# Patient Record
Sex: Female | Born: 1990 | Hispanic: No | Marital: Married | Smoking: Never smoker
Health system: Southern US, Community
[De-identification: ages and names within clinical notes are randomized; demographics above are authoritative.]

## PROBLEM LIST (undated history)

## (undated) DIAGNOSIS — R7303 Prediabetes: Secondary | ICD-10-CM

## (undated) DIAGNOSIS — F32A Depression, unspecified: Secondary | ICD-10-CM

## (undated) DIAGNOSIS — L309 Dermatitis, unspecified: Secondary | ICD-10-CM

## (undated) DIAGNOSIS — O24419 Gestational diabetes mellitus in pregnancy, unspecified control: Secondary | ICD-10-CM

## (undated) DIAGNOSIS — J302 Other seasonal allergic rhinitis: Secondary | ICD-10-CM

## (undated) HISTORY — DX: Other seasonal allergic rhinitis: J30.2

## (undated) HISTORY — PX: WISDOM TOOTH EXTRACTION: SHX21

## (undated) HISTORY — DX: Dermatitis, unspecified: L30.9

## (undated) HISTORY — PX: DILATION AND CURETTAGE OF UTERUS: SHX78

## (undated) HISTORY — DX: Prediabetes: R73.03

## (undated) HISTORY — PX: LIPOSUCTION: SHX10

---

## 2009-01-08 ENCOUNTER — Emergency Department (HOSPITAL_COMMUNITY): Admission: EM | Admit: 2009-01-08 | Discharge: 2009-01-08 | Payer: Self-pay | Admitting: Emergency Medicine

## 2009-07-23 ENCOUNTER — Inpatient Hospital Stay (HOSPITAL_COMMUNITY): Admission: AD | Admit: 2009-07-23 | Discharge: 2009-07-26 | Payer: Self-pay | Admitting: Obstetrics and Gynecology

## 2010-11-21 LAB — CBC
Hemoglobin: 10.1 g/dL — ABNORMAL LOW (ref 12.0–15.0)
MCV: 86.6 fL (ref 78.0–100.0)
Platelets: 130 10*3/uL — ABNORMAL LOW (ref 150–400)
Platelets: 143 10*3/uL — ABNORMAL LOW (ref 150–400)
RDW: 13.6 % (ref 11.5–15.5)
WBC: 14.4 10*3/uL — ABNORMAL HIGH (ref 4.0–10.5)
WBC: 9.4 10*3/uL (ref 4.0–10.5)

## 2010-11-21 LAB — RPR: RPR Ser Ql: NONREACTIVE

## 2010-11-28 LAB — URINALYSIS, ROUTINE W REFLEX MICROSCOPIC
Glucose, UA: NEGATIVE mg/dL
Protein, ur: NEGATIVE mg/dL
Urobilinogen, UA: 0.2 mg/dL (ref 0.0–1.0)

## 2010-11-28 LAB — WET PREP, GENITAL: Yeast Wet Prep HPF POC: NONE SEEN

## 2010-11-28 LAB — DIFFERENTIAL
Eosinophils Absolute: 0 10*3/uL (ref 0.0–0.7)
Eosinophils Relative: 1 % (ref 0–5)
Lymphs Abs: 1.8 10*3/uL (ref 0.7–4.0)
Monocytes Relative: 8 % (ref 3–12)

## 2010-11-28 LAB — CBC
HCT: 35.3 % — ABNORMAL LOW (ref 36.0–46.0)
MCV: 83.1 fL (ref 78.0–100.0)
RBC: 4.25 MIL/uL (ref 3.87–5.11)
WBC: 7 10*3/uL (ref 4.0–10.5)

## 2012-01-29 ENCOUNTER — Other Ambulatory Visit (HOSPITAL_COMMUNITY): Payer: Self-pay | Admitting: Obstetrics and Gynecology

## 2012-01-29 DIAGNOSIS — Z3682 Encounter for antenatal screening for nuchal translucency: Secondary | ICD-10-CM

## 2012-02-08 ENCOUNTER — Encounter (HOSPITAL_COMMUNITY): Payer: Self-pay

## 2012-02-08 ENCOUNTER — Ambulatory Visit (HOSPITAL_COMMUNITY): Admission: RE | Admit: 2012-02-08 | Payer: Self-pay | Source: Ambulatory Visit

## 2012-02-08 ENCOUNTER — Ambulatory Visit (HOSPITAL_COMMUNITY): Payer: Self-pay | Attending: Obstetrics and Gynecology

## 2012-07-08 ENCOUNTER — Other Ambulatory Visit: Payer: Self-pay | Admitting: Obstetrics and Gynecology

## 2012-08-11 ENCOUNTER — Inpatient Hospital Stay (HOSPITAL_COMMUNITY): Admit: 2012-08-11 | Payer: Self-pay | Admitting: Obstetrics and Gynecology

## 2012-08-11 ENCOUNTER — Encounter (HOSPITAL_COMMUNITY): Payer: Self-pay

## 2012-08-11 SURGERY — Surgical Case
Anesthesia: Choice

## 2014-06-21 ENCOUNTER — Encounter (HOSPITAL_COMMUNITY): Payer: Self-pay

## 2015-09-03 ENCOUNTER — Emergency Department (HOSPITAL_COMMUNITY)
Admission: EM | Admit: 2015-09-03 | Discharge: 2015-09-03 | Disposition: A | Discharge status: Home / Self Care | Payer: Medicaid Other | Attending: Emergency Medicine | Admitting: Emergency Medicine

## 2015-09-03 ENCOUNTER — Emergency Department (HOSPITAL_COMMUNITY): Payer: Medicaid Other

## 2015-09-03 ENCOUNTER — Encounter (HOSPITAL_COMMUNITY): Payer: Self-pay

## 2015-09-03 DIAGNOSIS — S0990XA Unspecified injury of head, initial encounter: Secondary | ICD-10-CM | POA: Diagnosis not present

## 2015-09-03 DIAGNOSIS — S61411A Laceration without foreign body of right hand, initial encounter: Secondary | ICD-10-CM | POA: Insufficient documentation

## 2015-09-03 DIAGNOSIS — R0602 Shortness of breath: Secondary | ICD-10-CM | POA: Diagnosis not present

## 2015-09-03 DIAGNOSIS — Y9289 Other specified places as the place of occurrence of the external cause: Secondary | ICD-10-CM | POA: Diagnosis not present

## 2015-09-03 DIAGNOSIS — S199XXA Unspecified injury of neck, initial encounter: Secondary | ICD-10-CM | POA: Diagnosis not present

## 2015-09-03 DIAGNOSIS — Z23 Encounter for immunization: Secondary | ICD-10-CM | POA: Diagnosis not present

## 2015-09-03 DIAGNOSIS — S0993XA Unspecified injury of face, initial encounter: Secondary | ICD-10-CM

## 2015-09-03 DIAGNOSIS — T1490XA Injury, unspecified, initial encounter: Secondary | ICD-10-CM

## 2015-09-03 DIAGNOSIS — Y9389 Activity, other specified: Secondary | ICD-10-CM | POA: Insufficient documentation

## 2015-09-03 DIAGNOSIS — Y998 Other external cause status: Secondary | ICD-10-CM | POA: Diagnosis not present

## 2015-09-03 DIAGNOSIS — S0083XA Contusion of other part of head, initial encounter: Secondary | ICD-10-CM | POA: Insufficient documentation

## 2015-09-03 MED ORDER — OXYCODONE-ACETAMINOPHEN 5-325 MG PO TABS: 1.0000 | ORAL_TABLET | Freq: Four times a day (QID) | ORAL | Status: DC | PRN

## 2015-09-03 MED ORDER — SODIUM CHLORIDE 0.9 % IV BOLUS (SEPSIS)
1000.0000 mL | Freq: Once | INTRAVENOUS | Status: AC
  Administered 2015-09-03: 1000 mL via INTRAVENOUS

## 2015-09-03 MED ORDER — BACITRACIN ZINC 500 UNIT/GM EX OINT
TOPICAL_OINTMENT | Freq: Two times a day (BID) | CUTANEOUS | Status: DC
  Administered 2015-09-03: 1 via TOPICAL

## 2015-09-03 MED ORDER — OXYCODONE-ACETAMINOPHEN 5-325 MG PO TABS
1.0000 | ORAL_TABLET | Freq: Once | ORAL | Status: AC
  Administered 2015-09-03: 1 via ORAL
  Filled 2015-09-03: qty 1

## 2015-09-03 MED ORDER — TETANUS-DIPHTH-ACELL PERTUSSIS 5-2.5-18.5 LF-MCG/0.5 IM SUSP
0.5000 mL | Freq: Once | INTRAMUSCULAR | Status: AC
  Administered 2015-09-03: 0.5 mL via INTRAMUSCULAR
  Filled 2015-09-03: qty 0.5

## 2015-09-03 MED ORDER — AMOXICILLIN-POT CLAVULANATE 875-125 MG PO TABS: 1.0000 | ORAL_TABLET | Freq: Two times a day (BID) | ORAL | Status: DC

## 2015-09-03 NOTE — Discharge Instructions (Signed)
1. Medications: Percocet for pain AS NEEDED - This can make you very drowsy - please do not drink or drive on this medication, continue usual home medications 2. Treatment: rest, drink plenty of fluids, do not blow nose until seen by ENT physician. 3. Follow Up: Please follow up with the ear, nose, and throat clinic listed in 1-3 days for discussion of your diagnoses and further evaluation after today's visit; Please return to the ER for vomiting, fevers, loss of vision, change in mental status, new or worsening symptoms, any additional concerns.

## 2015-09-03 NOTE — ED Notes (Addendum)
Veronica Bradley [phone] call when ready to be discharged

## 2015-09-03 NOTE — ED Notes (Signed)
Pt transported to CT ?

## 2015-09-03 NOTE — ED Notes (Signed)
Pt verbalized understanding of d/c instructions, prescriptions, and follow-up care. No further questions/concerns, VSS, assisted to lobby in wheelchair.  
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
## 2015-09-03 NOTE — ED Notes (Signed)
PA at bedside.
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
## 2016-03-03 ENCOUNTER — Emergency Department (HOSPITAL_COMMUNITY)
Admission: EM | Admit: 2016-03-03 | Discharge: 2016-03-04 | Disposition: A | Discharge status: Home / Self Care | Payer: Medicaid Other | Attending: Emergency Medicine | Admitting: Emergency Medicine

## 2016-03-03 ENCOUNTER — Encounter (HOSPITAL_COMMUNITY): Payer: Self-pay | Admitting: Emergency Medicine

## 2016-03-03 ENCOUNTER — Emergency Department (HOSPITAL_COMMUNITY): Payer: Medicaid Other

## 2016-03-03 DIAGNOSIS — Y92410 Unspecified street and highway as the place of occurrence of the external cause: Secondary | ICD-10-CM | POA: Insufficient documentation

## 2016-03-03 DIAGNOSIS — M25572 Pain in left ankle and joints of left foot: Secondary | ICD-10-CM | POA: Insufficient documentation

## 2016-03-03 DIAGNOSIS — Y999 Unspecified external cause status: Secondary | ICD-10-CM | POA: Insufficient documentation

## 2016-03-03 DIAGNOSIS — Y939 Activity, unspecified: Secondary | ICD-10-CM | POA: Insufficient documentation

## 2016-03-03 DIAGNOSIS — M542 Cervicalgia: Secondary | ICD-10-CM | POA: Diagnosis not present

## 2016-03-03 MED ORDER — METHOCARBAMOL 500 MG PO TABS: 500.0000 mg | ORAL_TABLET | Freq: Two times a day (BID) | ORAL | Status: DC

## 2016-03-03 MED ORDER — IBUPROFEN 600 MG PO TABS: 600.0000 mg | ORAL_TABLET | Freq: Four times a day (QID) | ORAL | Status: DC | PRN

## 2016-03-03 MED ORDER — HYDROCODONE-ACETAMINOPHEN 5-325 MG PO TABS
1.0000 | ORAL_TABLET | Freq: Once | ORAL | Status: AC
  Administered 2016-03-03: 1 via ORAL
  Filled 2016-03-03: qty 1

## 2016-03-03 MED ORDER — HYDROCODONE-ACETAMINOPHEN 5-325 MG PO TABS: 2.0000 | ORAL_TABLET | ORAL | Status: DC | PRN

## 2016-03-03 MED ORDER — METHOCARBAMOL 500 MG PO TABS
500.0000 mg | ORAL_TABLET | Freq: Once | ORAL | Status: AC
  Administered 2016-03-03: 500 mg via ORAL
  Filled 2016-03-03: qty 1
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
## 2016-03-03 NOTE — ED Notes (Signed)
AP at bedside 

## 2016-03-03 NOTE — Discharge Instructions (Signed)
Take your medications as prescribed as needed for pain relief. I recommend resting and applying ice to affected areas for 15-20 minutes 3-4 times daily as needed for pain relief. Please follow up with a primary care provider from the Resource Guide provided below in one week if your symptoms have not improved. Please return to the Emergency Department if symptoms worsen or new onset of fever, headache, visual changes, lightheadedness, dizziness, neck stiffness, chest pain, shortness of breath, abdominal pain, vomiting, loss of control of bowel or bladder, groin numbness, numbness, tingling, weakness, redness, swelling, warmth.

## 2016-04-10 ENCOUNTER — Encounter (HOSPITAL_COMMUNITY): Payer: Self-pay

## 2016-04-10 DIAGNOSIS — O26899 Other specified pregnancy related conditions, unspecified trimester: Secondary | ICD-10-CM | POA: Diagnosis not present

## 2016-04-10 DIAGNOSIS — Z5321 Procedure and treatment not carried out due to patient leaving prior to being seen by health care provider: Secondary | ICD-10-CM | POA: Diagnosis not present

## 2016-04-10 DIAGNOSIS — R1030 Lower abdominal pain, unspecified: Secondary | ICD-10-CM | POA: Insufficient documentation

## 2016-04-10 LAB — COMPREHENSIVE METABOLIC PANEL
ALBUMIN: 4 g/dL (ref 3.5–5.0)
ALT: 19 U/L (ref 14–54)
ANION GAP: 3 — AB (ref 5–15)
AST: 21 U/L (ref 15–41)
Alkaline Phosphatase: 50 U/L (ref 38–126)
BILIRUBIN TOTAL: 0.6 mg/dL (ref 0.3–1.2)
BUN: 9 mg/dL (ref 6–20)
CHLORIDE: 108 mmol/L (ref 101–111)
CO2: 25 mmol/L (ref 22–32)
Calcium: 9.3 mg/dL (ref 8.9–10.3)
Creatinine, Ser: 0.79 mg/dL (ref 0.44–1.00)
GFR calc Af Amer: >60 mL/min (ref >60)
GLUCOSE: 98 mg/dL (ref 65–99)
POTASSIUM: 3.4 mmol/L — AB (ref 3.5–5.1)
Sodium: 136 mmol/L (ref 135–145)
TOTAL PROTEIN: 6.6 g/dL (ref 6.5–8.1)

## 2016-04-10 LAB — CBC
HEMATOCRIT: 38.7 % (ref 36.0–46.0)
HEMOGLOBIN: 12.7 g/dL (ref 12.0–15.0)
MCH: 27 pg (ref 26.0–34.0)
MCHC: 32.8 g/dL (ref 30.0–36.0)
MCV: 82.2 fL (ref 78.0–100.0)
Platelets: 248 10*3/uL (ref 150–400)
RBC: 4.71 MIL/uL (ref 3.87–5.11)
RDW: 12.8 % (ref 11.5–15.5)
WBC: 6.5 10*3/uL (ref 4.0–10.5)

## 2016-04-10 LAB — I-STAT BETA HCG BLOOD, ED (MC, WL, AP ONLY): I-stat hCG, quantitative: >2000 m[IU]/mL — ABNORMAL HIGH (ref <5)

## 2016-04-10 LAB — LIPASE, BLOOD: LIPASE: 29 U/L (ref 11–51)

## 2016-04-10 NOTE — ED Triage Notes (Signed)
Pt seen at [facility] for lower abd pain.  + pregnancy.  Pt is having no pain today.  [facility] wanted her to be seen in ED.

## 2016-04-11 ENCOUNTER — Emergency Department (HOSPITAL_COMMUNITY)
Admission: EM | Admit: 2016-04-11 | Discharge: 2016-04-11 | Disposition: A | Discharge status: Home / Self Care | Payer: Medicaid Other | Attending: Emergency Medicine | Admitting: Emergency Medicine

## 2016-04-11 NOTE — ED Notes (Signed)
No answer when called for room assignment  

## 2016-04-26 DIAGNOSIS — Z5321 Procedure and treatment not carried out due to patient leaving prior to being seen by health care provider: Secondary | ICD-10-CM | POA: Insufficient documentation

## 2016-04-26 DIAGNOSIS — R1030 Lower abdominal pain, unspecified: Secondary | ICD-10-CM | POA: Insufficient documentation

## 2016-04-27 ENCOUNTER — Emergency Department (HOSPITAL_COMMUNITY)
Admission: EM | Admit: 2016-04-27 | Discharge: 2016-04-27 | Disposition: A | Discharge status: Home / Self Care | Payer: Medicaid Other | Attending: Dermatology | Admitting: Dermatology

## 2016-04-27 ENCOUNTER — Encounter (HOSPITAL_COMMUNITY): Payer: Self-pay | Admitting: Emergency Medicine

## 2016-04-27 LAB — COMPREHENSIVE METABOLIC PANEL
ALBUMIN: 3.8 g/dL (ref 3.5–5.0)
ALK PHOS: 44 U/L (ref 38–126)
ALT: 16 U/L (ref 14–54)
AST: 17 U/L (ref 15–41)
Anion gap: 7 (ref 5–15)
BILIRUBIN TOTAL: 0.3 mg/dL (ref 0.3–1.2)
BUN: 10 mg/dL (ref 6–20)
CALCIUM: 9.3 mg/dL (ref 8.9–10.3)
CO2: 22 mmol/L (ref 22–32)
CREATININE: 0.87 mg/dL (ref 0.44–1.00)
Chloride: 106 mmol/L (ref 101–111)
GFR calc Af Amer: >60 mL/min (ref >60)
GFR calc non Af Amer: >60 mL/min (ref >60)
GLUCOSE: 94 mg/dL (ref 65–99)
Potassium: 3.6 mmol/L (ref 3.5–5.1)
Sodium: 135 mmol/L (ref 135–145)
TOTAL PROTEIN: 6.4 g/dL — AB (ref 6.5–8.1)

## 2016-04-27 LAB — CBC
HCT: 36.3 % (ref 36.0–46.0)
HEMOGLOBIN: 12.2 g/dL (ref 12.0–15.0)
MCH: 27.4 pg (ref 26.0–34.0)
MCHC: 33.6 g/dL (ref 30.0–36.0)
MCV: 81.4 fL (ref 78.0–100.0)
PLATELETS: 227 10*3/uL (ref 150–400)
RBC: 4.46 MIL/uL (ref 3.87–5.11)
RDW: 12.5 % (ref 11.5–15.5)
WBC: 8.8 10*3/uL (ref 4.0–10.5)

## 2016-04-27 LAB — I-STAT BETA HCG BLOOD, ED (MC, WL, AP ONLY)

## 2016-04-27 LAB — LIPASE, BLOOD: Lipase: 38 U/L (ref 11–51)

## 2016-04-27 LAB — HCG, QUANTITATIVE, PREGNANCY: HCG, BETA CHAIN, QUANT, S: 60101 m[IU]/mL — AB (ref <5)

## 2016-04-27 NOTE — ED Notes (Signed)
pts name called for a room no answer 

## 2016-04-27 NOTE — ED Notes (Signed)
pts name called to recheck vitals no answer 

## 2016-04-27 NOTE — ED Triage Notes (Signed)
Pt. reports low abdominal pain with diarrhea and mild nausea onset 5 days ago , no fever or chills , pt. stated that she is pregnant unsure of AOG , LMP last month .

## 2016-05-30 DIAGNOSIS — Z3480 Encounter for supervision of other normal pregnancy, unspecified trimester: Secondary | ICD-10-CM | POA: Insufficient documentation

## 2017-03-11 ENCOUNTER — Other Ambulatory Visit (HOSPITAL_COMMUNITY): Payer: Self-pay | Admitting: *Deleted

## 2017-03-11 ENCOUNTER — Ambulatory Visit (HOSPITAL_COMMUNITY)
Admission: RE | Admit: 2017-03-11 | Discharge: 2017-03-11 | Disposition: A | Discharge status: Home / Self Care | Payer: Medicaid Other | Source: Ambulatory Visit | Attending: *Deleted | Admitting: *Deleted

## 2017-03-11 DIAGNOSIS — M25572 Pain in left ankle and joints of left foot: Secondary | ICD-10-CM | POA: Insufficient documentation

## 2017-03-11 DIAGNOSIS — R600 Localized edema: Secondary | ICD-10-CM

## 2017-03-11 DIAGNOSIS — R52 Pain, unspecified: Secondary | ICD-10-CM

## 2017-03-11 DIAGNOSIS — M25571 Pain in right ankle and joints of right foot: Secondary | ICD-10-CM | POA: Diagnosis present

## 2017-03-11 DIAGNOSIS — R609 Edema, unspecified: Secondary | ICD-10-CM | POA: Diagnosis not present

## 2017-04-04 ENCOUNTER — Encounter: Payer: Self-pay | Admitting: Physical Therapy

## 2017-04-04 ENCOUNTER — Ambulatory Visit: Payer: Medicaid Other | Attending: Nurse Practitioner | Admitting: Physical Therapy

## 2017-04-04 DIAGNOSIS — M25572 Pain in left ankle and joints of left foot: Secondary | ICD-10-CM | POA: Diagnosis present

## 2017-04-04 DIAGNOSIS — R262 Difficulty in walking, not elsewhere classified: Secondary | ICD-10-CM | POA: Diagnosis present

## 2017-04-04 DIAGNOSIS — M6281 Muscle weakness (generalized): Secondary | ICD-10-CM | POA: Diagnosis present

## 2017-04-04 DIAGNOSIS — M25571 Pain in right ankle and joints of right foot: Secondary | ICD-10-CM | POA: Diagnosis not present
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
## 2017-04-24 ENCOUNTER — Encounter: Payer: Self-pay | Admitting: Physical Therapy

## 2017-04-24 ENCOUNTER — Ambulatory Visit: Payer: Medicaid Other | Admitting: Physical Therapy

## 2017-04-24 ENCOUNTER — Ambulatory Visit: Payer: Medicaid Other | Attending: Nurse Practitioner | Admitting: Physical Therapy

## 2017-04-24 DIAGNOSIS — M25572 Pain in left ankle and joints of left foot: Secondary | ICD-10-CM

## 2017-04-24 DIAGNOSIS — M25571 Pain in right ankle and joints of right foot: Secondary | ICD-10-CM | POA: Diagnosis not present

## 2017-04-24 DIAGNOSIS — M6281 Muscle weakness (generalized): Secondary | ICD-10-CM | POA: Insufficient documentation

## 2017-04-24 DIAGNOSIS — R262 Difficulty in walking, not elsewhere classified: Secondary | ICD-10-CM | POA: Diagnosis present

## 2017-04-24 NOTE — Therapy (Signed)
[facility] [place], [place], [address] Phone: [phone]   Fax:  [phone]  Physical Therapy Treatment  Patient Details  Name: Veronica DupesChanel Nutting MRN: [id number] Date of Birth: 04/24/1991 Referring Provider: Dorinda Hillarter, Alisha Shanelle, NP  Encounter Date: 04/24/2017      PT End of Session - 04/24/17 1547    Visit Number 2   Number of Visits 9   Date for PT Re-Evaluation 05/03/17   Authorization Type MCD to self pay   PT Start Time 1547   PT Stop Time 1631   PT Time Calculation (min) 44 min   Activity Tolerance Patient tolerated treatment well   Behavior During Therapy [facility] for tasks assessed/performed      History reviewed. No pertinent past medical history.  Past Surgical History:  Procedure Laterality Date  . DILATION AND CURETTAGE OF UTERUS     X3  . WISDOM TOOTH EXTRACTION      There were no vitals filed for this visit.      Subjective Assessment - 04/24/17 1549    Subjective L ankle is bothering her, began to hurt when she put her tennis shoes on today. Has been trying to do more walking/working out recently.    Patient Stated Goals decrease pain, ankle stability, increase strength   Currently in Pain? Yes   Pain Score 6    Pain Location Ankle   Pain Orientation Right;Left   Pain Descriptors / Indicators Sharp   Aggravating Factors  wearing shoes   Pain Relieving Factors rest                         [facility] Adult PT Treatment/Exercise - 04/24/17 0001      Modalities   Modalities Ultrasound     Ultrasound   Ultrasound Location bilat anterior/lateral ankles   Ultrasound Parameters 5' each pulsed .8w/cm2   Ultrasound Goals Pain     Ankle Exercises: Aerobic   Stationary Bike nu step L4 5 min     Ankle Exercises: Seated   Other Seated Ankle Exercises toe yoga, short foot     Ankle Exercises: Supine   Other Supine Ankle Exercises bridge with clam-blue band     Ankle  Exercises: Sidelying   Other Sidelying Ankle Exercises clamshell blue tband     Ankle Exercises: Stretches   Slant Board Stretch 2 reps;30 seconds                     PT Long Term Goals - 04/04/17 1814      PT LONG TERM GOAL #1   Title DF to 10 deg bilat for necessary ROM through stance phase of gait   Baseline R 0, L 8   Time 4   Period Weeks   Status New   Target Date 05/03/17     PT LONG TERM GOAL #2   Title Gross ankle and hip strength to 5/5 bilat for appropraite stability to biomechanical chain   Baseline see flowsheet   Time 4   Period Weeks   Status New   Target Date 05/03/17     PT LONG TERM GOAL #3   Title Pt will demo good control of SLS balance on steady and unsteady surfaces for support to ankle on variable surfaces while walking   Baseline poor stability on stable ground at eval   Time 4   Period Weeks   Status New   Target Date 05/03/17  PT LONG TERM GOAL #4   Title Ankle pain <=3/10 at most with activities to decrease pain effects on daily activities   Baseline 7/10 at eval   Time 4   Period Weeks   Status New   Target Date 05/03/17     PT LONG TERM GOAL #5   Title Pt will be independent with HEP for long term care and strengthening at ankles   Baseline will progress as appropriate   Time 4   Period Weeks   Target Date 05/03/17               Plan - 04/24/17 1631    Clinical Impression Statement Pt reported feeling tired with less pain after treatment today. Weakness in hip abductors and intrinsic foot musculature notable allowing inversion in standing.    PT Treatment/Interventions ADLs/Self Care Home Management;Cryotherapy;Electrical Stimulation;Iontophoresis 4mg /ml Dexamethasone;Functional mobility training;Stair training;Gait training;Ultrasound;Traction;Moist Heat;Therapeutic activities;Therapeutic exercise;Balance training;Neuromuscular re-education;Patient/family education;Passive range of motion;Manual techniques;Dry  needling;Taping   PT Next Visit Plan *status is pregnant-no [place], ionto, estim   PT Home Exercise Plan long sitting DF stretch, ankle 4 way; short foot, toe yoga, bridge with clam, clamshells   Consulted and Agree with Plan of Care Patient      Patient will benefit from skilled therapeutic intervention in order to improve the following deficits and impairments:  Decreased range of motion, Difficulty walking, Increased muscle spasms, Decreased activity tolerance, Pain, Impaired flexibility, Improper body mechanics, Decreased mobility, Decreased strength  Visit Diagnosis: Pain in right ankle and joints of right foot  Pain in left ankle and joints of left foot  Muscle weakness (generalized)  Difficulty in walking, not elsewhere classified     Problem List There are no active problems to display for this patient.  Luevenia Mcavoy C. Loretta Doutt PT, DPT 04/24/17 5:30 PM   [facility] [address] [place], [place], [address] Phone: [phone]   Fax:  [phone]  Name: Veronica Bradley MRN: [id number] Date of Birth: 1990/12/02

## 2017-05-01 ENCOUNTER — Ambulatory Visit: Payer: Medicaid Other | Admitting: Physical Therapy

## 2017-05-01 ENCOUNTER — Telehealth: Payer: Self-pay | Admitting: Physical Therapy

## 2017-05-01 NOTE — Telephone Encounter (Signed)
Attempted to contact pt regarding no show today, voicemail is full. Keyaan Lederman C. Chestina Komatsu PT, DPT 05/01/17 1:11 PM

## 2017-05-14 ENCOUNTER — Encounter: Payer: Self-pay | Admitting: Physical Therapy

## 2017-05-14 ENCOUNTER — Ambulatory Visit: Payer: Medicaid Other | Admitting: Physical Therapy

## 2017-05-14 DIAGNOSIS — M25572 Pain in left ankle and joints of left foot: Secondary | ICD-10-CM

## 2017-05-14 DIAGNOSIS — M25571 Pain in right ankle and joints of right foot: Secondary | ICD-10-CM | POA: Diagnosis not present

## 2017-05-14 DIAGNOSIS — M6281 Muscle weakness (generalized): Secondary | ICD-10-CM

## 2017-05-14 DIAGNOSIS — R262 Difficulty in walking, not elsewhere classified: Secondary | ICD-10-CM

## 2017-05-14 NOTE — Therapy (Signed)
[place], [place], [address] Phone: [phone]   Fax:  [phone]  Physical Therapy Treatment/ERO  Patient Details  Name: Veronica Bradley MRN: [id number] Date of Birth: [birth year] Referring Provider: Rubbie Battiest, NP  Encounter Date: 05/14/2017      PT End of Session - 05/14/17 1427    Visit Number 3   Number of Visits 11   Date for PT Re-Evaluation 06/14/17   Authorization Type MCD to self pay   PT Start Time 1427  pt not checked in   PT Stop Time 1504   PT Time Calculation (min) 37 min   Activity Tolerance Patient tolerated treatment well   Behavior During Therapy [facility] for tasks assessed/performed      History reviewed. No pertinent past medical history.  Past Surgical History:  Procedure Laterality Date  . DILATION AND CURETTAGE OF UTERUS     X3  . WISDOM TOOTH EXTRACTION      There were no vitals filed for this visit.      Subjective Assessment - 05/14/17 1427    Subjective Has been sitting a lot so she has not had much pain. 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  I think I am trying to overdo it when I exercise.    Patient Stated Goals decrease pain, ankle stability, increase strength   Currently in Pain? Yes   Pain Score 6    Pain Location Ankle  rubs around entire ankle   Pain Orientation Right;Left  not as much in the L today            [facility] PT Assessment - 05/14/17 0001      Assessment   Medical Diagnosis bilateral ankle pain   Referring Provider Rubbie Battiest, NP     AROM   Right Ankle Dorsiflexion --  passive 8   Left Ankle Dorsiflexion --  passive 10     Strength   Right/Left Hip Right;Left   Right Hip Flexion 5/5   Right Hip Extension 4/5   Right Hip ABduction 4+/5   Left Hip Flexion 5/5   Left Hip Extension 5/5   Left Hip ABduction 4/5   Right/Left Ankle Right;Left   Right Ankle Eversion --  pain   Left Ankle Eversion  --  pain                     [facility] Adult PT Treatment/Exercise - 05/14/17 0001      Manual Therapy   Manual Therapy Soft tissue mobilization   Soft tissue mobilization IASTM bilateral peroneals                PT Education - 05/14/17 1522    Education provided Yes   Education Details obtaining arch supports, reducing plyometric pressure through legs, shoe wear, goals & POC discussion   Person(s) Educated Patient   Methods Explanation   Comprehension Verbalized understanding;Need further instruction             PT Long Term Goals - 05/14/17 1436      PT LONG TERM GOAL #1   Title DF to 10 deg bilat for necessary ROM through stance phase of gait   Baseline R 8, L 10   Status Partially Met     PT LONG TERM GOAL #2   Title Gross ankle and hip strength to 5/5 bilat for appropraite stability to biomechanical chain   Baseline see flowsheet   Status  On-going     PT LONG TERM GOAL #3   Title Pt will demo good control of SLS balance on steady and unsteady surfaces for support to ankle on variable surfaces while walking   Baseline good stability on R on stolid surface, poor on L; bilat poor on unstable surface   Status On-going     PT LONG TERM GOAL #4   Title Ankle pain <=3/10 at most with activities to decrease pain effects on daily activities   Baseline average with daily activities 7/10   Status On-going     PT LONG TERM GOAL #5   Title Pt will be independent with HEP for long term care and strengthening at ankles   Baseline is doing exercises and will progress as approprriate   Status On-going               Plan - 05/14/17 1511    Clinical Impression Statement Pt cont to have pain in bilateral ankles that is exacerbated by walking/heavy exercises and resolved with rest. Distal anterior tibialis tendon TTP and bilateral peroneal muscle bellies TTP. Bilateral flexible arch resulting in collapse and inversion through ankles in stance phase. S/s  consistent with tendonopathy. Discussed "shin splints" and utilizing compression garments while walking and to reduce walking distance. Encouraged her to utilize bike for cardio to reduce plyometric pressure. Discussed frequency of returning to PT appointments and requested that she attend 2/week for the next 4 weeks to improve effectiveness of treatment.    PT Treatment/Interventions ADLs/Self Care Home Management;Cryotherapy;Electrical Stimulation;Iontophoresis 17m/ml Dexamethasone;Functional mobility training;Stair training;Gait training;Ultrasound;Traction;Moist Heat;Therapeutic activities;Therapeutic exercise;Balance training;Neuromuscular re-education;Patient/family education;Passive range of motion;Manual techniques;Dry needling;Taping   PT Next Visit Plan *pt denies pregnancy at this time, use modalities PRN.    PT Home Exercise Plan long sitting DF stretch, ankle 4 way; short foot, toe yoga, bridge with clam, clamshells   Consulted and Agree with Plan of Care Patient      Patient will benefit from skilled therapeutic intervention in order to improve the following deficits and impairments:  Decreased range of motion, Difficulty walking, Increased muscle spasms, Decreased activity tolerance, Pain, Impaired flexibility, Improper body mechanics, Decreased mobility, Decreased strength  Visit Diagnosis: Pain in right ankle and joints of right foot - Plan: PT plan of care cert/re-cert  Pain in left ankle and joints of left foot - Plan: PT plan of care cert/re-cert  Muscle weakness (generalized) - Plan: PT plan of care cert/re-cert  Difficulty in walking, not elsewhere classified - Plan: PT plan of care cert/re-cert     Problem List There are no active problems to display for this patient.   Jesselee Poth C. Braeley Buskey PT, DPT 05/14/17 3:24 PM   [facility] [address]Phone: [phone]  Fax:   [phone] Name: Veronica DeloreyMRN: [id number] of Birth: [birth year]

## 2017-05-17 ENCOUNTER — Telehealth: Payer: Self-pay | Admitting: Physical Therapy

## 2017-05-17 ENCOUNTER — Encounter: Payer: Medicaid Other | Admitting: Physical Therapy

## 2017-05-17 NOTE — Telephone Encounter (Signed)
Spoke to pt regarding missed appointment this morning. She reports getting off work too late to attend.  She plans to attend her next scheduled appointment. Asked her to call and cancel any appointment she cannot attend in the future.

## 2017-05-22 ENCOUNTER — Ambulatory Visit: Payer: Medicaid Other | Attending: Nurse Practitioner | Admitting: Physical Therapy

## 2017-05-22 ENCOUNTER — Encounter: Payer: Self-pay | Admitting: Physical Therapy

## 2017-05-22 DIAGNOSIS — R262 Difficulty in walking, not elsewhere classified: Secondary | ICD-10-CM | POA: Insufficient documentation

## 2017-05-22 DIAGNOSIS — M6281 Muscle weakness (generalized): Secondary | ICD-10-CM | POA: Diagnosis present

## 2017-05-22 DIAGNOSIS — M25572 Pain in left ankle and joints of left foot: Secondary | ICD-10-CM

## 2017-05-22 DIAGNOSIS — M25571 Pain in right ankle and joints of right foot: Secondary | ICD-10-CM | POA: Insufficient documentation

## 2017-05-22 NOTE — Therapy (Signed)
[place], [place], [address] Phone: [phone]   Fax:  [phone]  Physical Therapy Treatment  Patient Details  Name: Veronica Bradley MRN: [id number] Date of Birth: 1991-08-02 Referring Provider: Rubbie Battiest, NP  Encounter Date: 05/22/2017      PT End of Session - 05/22/17 1317    Visit Number 4   Number of Visits 11   Date for PT Re-Evaluation 06/14/17   PT Start Time 0803   PT Stop Time 9211   PT Time Calculation (min) 44 min   Activity Tolerance Patient tolerated treatment well   Behavior During Therapy [facility] for tasks assessed/performed      History reviewed. No pertinent past medical history.  Past Surgical History:  Procedure Laterality Date  . DILATION AND CURETTAGE OF UTERUS     X3  . WISDOM TOOTH EXTRACTION      There were no vitals filed for this visit.      Subjective Assessment - 05/22/17 0809    Subjective I have a cold.  6/10 yesterday.  It will get to 7/10.  Exercises have helped a little.   She was standing 10 minutes in her kitchen and her feet fell asleep.                         [facility] Adult PT Treatment/Exercise - 05/22/17 0001      Manual Therapy   Manual Therapy Joint mobilization   Manual therapy comments right fibula taped posteriorly   Joint Mobilization 3rd ray left foot  glides.grade 2. Fibular glides A/P and ankle mobs with movenent,  supine andstanding.  ROM increased. Both   DF increased post manuat  bilateral     Ankle Exercises: Aerobic   Stationary Bike 5 minutes 20 seconds  pain increased on the ball of her feet.                PT Education - 05/22/17 1316    Education provided Yes   Education Details Anatomy,    Person(s) Educated Patient   Methods Explanation;Demonstration   Comprehension Verbalized understanding             PT Long Term Goals - 05/14/17 1436      PT LONG TERM GOAL #1   Title DF to  10 deg bilat for necessary ROM through stance phase of gait   Baseline R 8, L 10   Status Partially Met     PT LONG TERM GOAL #2   Title Gross ankle and hip strength to 5/5 bilat for appropraite stability to biomechanical chain   Baseline see flowsheet   Status On-going     PT LONG TERM GOAL #3   Title Pt will demo good control of SLS balance on steady and unsteady surfaces for support to ankle on variable surfaces while walking   Baseline good stability on R on stolid surface, poor on L; bilat poor on unstable surface   Status On-going     PT LONG TERM GOAL #4   Title Ankle pain <=3/10 at most with activities to decrease pain effects on daily activities   Baseline average with daily activities 7/10   Status On-going     PT LONG TERM GOAL #5   Title Pt will be independent with HEP for long term care and strengthening at ankles   Baseline is doing exercises and will progress as approprriate   Status On-going  Plan - 05/22/17 1317    Clinical Impression Statement Manual focus today. Incerased DF noted post manual.  Patient wearing shoes with no inserts, even the ones that came with the shoes.  She has a lot of questions,( some were answered last visit) I did my best to answer with the skeleton.   PT Treatment/Interventions ADLs/Self Care Home Management;Cryotherapy;Electrical Stimulation;Iontophoresis 52m/ml Dexamethasone;Functional mobility training;Stair training;Gait training;Ultrasound;Traction;Moist Heat;Therapeutic activities;Therapeutic exercise;Balance training;Neuromuscular re-education;Patient/family education;Passive range of motion;Manual techniques;Dry needling;Taping   PT Next Visit Plan assess tape,  Modalities see if patient followed up with inserts.   PT Home Exercise Plan long sitting DF stretch, ankle 4 way; short foot, toe yoga, bridge with clam, clamshells   Consulted and Agree with Plan of Care Patient      Patient will benefit from skilled  therapeutic intervention in order to improve the following deficits and impairments:     Visit Diagnosis: Pain in right ankle and joints of right foot  Pain in left ankle and joints of left foot  Muscle weakness (generalized)  Difficulty in walking, not elsewhere classified     Problem List There are no active problems to display for this patient.   HARRIS,KAREN PTA 05/22/2017, 1:22 PM  C[facility] LLC17030 Sunset AvenueGMiddleburg N[place] [address]Phone: [phone]  Fax:  [phone] Name: Veronica LuckenbaughMRN: [id number] of Birth: [birth year]

## 2017-05-24 ENCOUNTER — Ambulatory Visit: Payer: Medicaid Other | Admitting: Physical Therapy

## 2017-05-29 ENCOUNTER — Ambulatory Visit: Payer: Medicaid Other | Admitting: Physical Therapy

## 2017-05-29 ENCOUNTER — Encounter: Payer: Self-pay | Admitting: Physical Therapy

## 2017-05-29 DIAGNOSIS — M25572 Pain in left ankle and joints of left foot: Secondary | ICD-10-CM

## 2017-05-29 DIAGNOSIS — M25571 Pain in right ankle and joints of right foot: Secondary | ICD-10-CM | POA: Diagnosis not present

## 2017-05-29 DIAGNOSIS — R262 Difficulty in walking, not elsewhere classified: Secondary | ICD-10-CM

## 2017-05-29 DIAGNOSIS — M6281 Muscle weakness (generalized): Secondary | ICD-10-CM

## 2017-05-29 NOTE — Therapy (Signed)
[place], [place], [address] Phone: [phone]   Fax:  [phone]  Physical Therapy Treatment  Patient Details  Name: Veronica Bradley MRN: [id number] Date of Birth: 07-27-1991 Referring Provider: Rubbie Battiest, NP  Encounter Date: 05/29/2017      PT End of Session - 05/29/17 1647    Visit Number 5   Number of Visits 11   Date for PT Re-Evaluation 06/14/17   Authorization Type MCD to self pay   PT Start Time 1546   PT Stop Time 1631   PT Time Calculation (min) 45 min   Activity Tolerance Patient tolerated treatment well   Behavior During Therapy [facility] for tasks assessed/performed      History reviewed. No pertinent past medical history.  Past Surgical History:  Procedure Laterality Date  . DILATION AND CURETTAGE OF UTERUS     X3  . WISDOM TOOTH EXTRACTION      There were no vitals filed for this visit.      Subjective Assessment - 05/29/17 1548    Subjective "the ankle is doing okay, pain is still about a 6/10"    Currently in Pain? Yes   Pain Score 6    Pain Location Ankle   Pain Orientation Right   Aggravating Factors  wearing shoes   Pain Relieving Factors resting                         [facility] Adult PT Treatment/Exercise - 05/29/17 1623      Ultrasound   Ultrasound Location R ankle sinus tarsi/ ATFL   Ultrasound Parameters 100% x 8 min .5 w/cm2    Ultrasound Goals Pain     Manual Therapy   Manual Therapy Other (comment);Taping   Joint Mobilization Grade 5 ankle manipulation with cavitation, Grade 3 MCP mobs AP/PA 1st ray   Soft tissue mobilization IASTM over distsal peroneals, anterior tib and ATFL   McConnell --  Athletic ankle taping      Ankle Exercises: Seated   Towel Crunch 4 reps     Ankle Exercises: Supine   T-Band 4-way 1 x 20 ea.   provided green theraband                PT Education - 05/29/17 1650    Education provided  Yes   Education Details benefits of ankle taping to promote ankle stability, benefits of strengthening to provide stability.    Person(s) Educated Patient   Methods Explanation;Verbal cues   Comprehension Verbalized understanding;Verbal cues required             PT Long Term Goals - 05/14/17 1436      PT LONG TERM GOAL #1   Title DF to 10 deg bilat for necessary ROM through stance phase of gait   Baseline R 8, L 10   Status Partially Met     PT LONG TERM GOAL #2   Title Gross ankle and hip strength to 5/5 bilat for appropraite stability to biomechanical chain   Baseline see flowsheet   Status On-going     PT LONG TERM GOAL #3   Title Pt will demo good control of SLS balance on steady and unsteady surfaces for support to ankle on variable surfaces while walking   Baseline good stability on R on stolid surface, poor on L; bilat poor on unstable surface   Status On-going     PT LONG TERM GOAL #4  Title Ankle pain <=3/10 at most with activities to decrease pain effects on daily activities   Baseline average with daily activities 7/10   Status On-going     PT LONG TERM GOAL #5   Title Pt will be independent with HEP for long term care and strengthening at ankles   Baseline is doing exercises and will progress as approprriate   Status On-going               Plan - 05/29/17 1647    Clinical Impression Statement pt reported pain at 6/10 today. continued US increasing to 100% and manual techniques including manipulation. continued ankle strengthening and intrinsic exercises. trialed athletic ankle taping to provide support. she reported decreased pain post session.    PT Next Visit Plan assess tape,  Modalities see if patient followed up with inserts. ankle strengthening, begin balance training,    PT Home Exercise Plan long sitting DF stretch, ankle 4 way; short foot, toe yoga, bridge with clam, clamshells   Consulted and Agree with Plan of Care Patient      Patient  will benefit from skilled therapeutic intervention in order to improve the following deficits and impairments:  Decreased range of motion, Difficulty walking, Increased muscle spasms, Decreased activity tolerance, Pain, Impaired flexibility, Improper body mechanics, Decreased mobility, Decreased strength  Visit Diagnosis: Pain in right ankle and joints of right foot  Pain in left ankle and joints of left foot  Muscle weakness (generalized)  Difficulty in walking, not elsewhere classified     Problem List There are no active problems to display for this patient.  Kristoffer Leamon PT, DPT, LAT, ATC  05/29/17  4:52 PM      [facility] [address] [place], [place], [address] Phone: [phone]   Fax:  [phone]  Name: Yasmeen Broadhead MRN: [id number] Date of Birth: 06/06/1991   

## 2017-05-31 ENCOUNTER — Ambulatory Visit: Payer: Medicaid Other | Admitting: Physical Therapy

## 2017-06-04 ENCOUNTER — Telehealth: Payer: Self-pay | Admitting: Physical Therapy

## 2017-06-04 ENCOUNTER — Encounter: Payer: Medicaid Other | Admitting: Physical Therapy

## 2017-06-04 ENCOUNTER — Ambulatory Visit: Payer: Medicaid Other | Admitting: Physical Therapy

## 2017-06-04 NOTE — Telephone Encounter (Signed)
Attempted to contact pt regarding no show today. VM box is full, unable to leave message. Jannett Schmall C. Alla Sloma PT, DPT 06/04/17 11:50 AM

## 2017-06-06 ENCOUNTER — Ambulatory Visit: Payer: Medicaid Other | Admitting: Physical Therapy

## 2017-06-06 ENCOUNTER — Encounter: Payer: Self-pay | Admitting: Physical Therapy

## 2017-06-06 DIAGNOSIS — M25572 Pain in left ankle and joints of left foot: Secondary | ICD-10-CM

## 2017-06-06 DIAGNOSIS — M6281 Muscle weakness (generalized): Secondary | ICD-10-CM

## 2017-06-06 DIAGNOSIS — R262 Difficulty in walking, not elsewhere classified: Secondary | ICD-10-CM

## 2017-06-06 DIAGNOSIS — M25571 Pain in right ankle and joints of right foot: Secondary | ICD-10-CM

## 2017-06-06 NOTE — Therapy (Signed)
[place], [place], [address] Phone: [phone]   Fax:  [phone]  Physical Therapy Treatment  Patient Details  Name: Veronica Bradley MRN: [id number] Date of Birth: [birth year] Referring Provider: Rubbie Battiest, NP  Encounter Date: 06/06/2017      PT End of Session - 06/06/17 0917    Visit Number 6   Number of Visits 11   Date for PT Re-Evaluation 06/14/17   Authorization Type MCD to self pay   PT Start Time 0928   PT Stop Time 1010   PT Time Calculation (min) 42 min   Activity Tolerance Patient tolerated treatment well   Behavior During Therapy [facility] for tasks assessed/performed      History reviewed. No pertinent past medical history.  Past Surgical History:  Procedure Laterality Date  . DILATION AND CURETTAGE OF UTERUS     X3  . WISDOM TOOTH EXTRACTION      There were no vitals filed for this visit.      Subjective Assessment - 06/06/17 0928    Subjective Reports slipping in the grass earlier this week resulting in large cut in R foot. Reports ankles have been okay. Has been doing HEP with bands and rolling. Has been avoiding other workouts.    Patient Stated Goals decrease pain, ankle stability, increase strength   Currently in Pain? Yes   Pain Score 5    Pain Location Ankle   Pain Orientation Right;Left                         [facility] Adult PT Treatment/Exercise - 06/06/17 0001      Ultrasound   Ultrasound Location R ATFL   Ultrasound Parameters cont 8 min .5 w/cm2   Ultrasound Goals Pain     Ankle Exercises: Aerobic   Stationary Bike 5 min L3     Ankle Exercises: Standing   SLS with short foot activation on airex   Other Standing Ankle Exercises short foot on airex     Ankle Exercises: Sidelying   Ankle Inversion 20 reps;10 reps;Both   Other Sidelying Ankle Exercises hip abduction 2# x30 each                PT Education - 06/06/17 1012     Education provided Yes   Education Details shoe wear & arch support   Person(s) Educated Patient   Methods Explanation;Demonstration;Tactile cues;Verbal cues   Comprehension Verbalized understanding;Returned demonstration;Tactile cues required;Need further instruction;Verbal cues required             PT Long Term Goals - 05/14/17 1436      PT LONG TERM GOAL #1   Title DF to 10 deg bilat for necessary ROM through stance phase of gait   Baseline R 8, L 10   Status Partially Met     PT LONG TERM GOAL #2   Title Gross ankle and hip strength to 5/5 bilat for appropraite stability to biomechanical chain   Baseline see flowsheet   Status On-going     PT LONG TERM GOAL #3   Title Pt will demo good control of SLS balance on steady and unsteady surfaces for support to ankle on variable surfaces while walking   Baseline good stability on R on stolid surface, poor on L; bilat poor on unstable surface   Status On-going     PT LONG TERM GOAL #4   Title Ankle pain <=3/10 at most  with activities to decrease pain effects on daily activities   Baseline average with daily activities 7/10   Status On-going     PT LONG TERM GOAL #5   Title Pt will be independent with HEP for long term care and strengthening at ankles   Baseline is doing exercises and will progress as approprriate   Status On-going               Plan - 06/06/17 1009    Clinical Impression Statement pt with singificant difficulty in exercises today. Cont to demo flat arches resulting in compression at ATFL, will bring inserts to next visit to review and determine need for other inserts.    PT Treatment/Interventions ADLs/Self Care Home Management;Cryotherapy;Electrical Stimulation;Iontophoresis 53m/ml Dexamethasone;Functional mobility training;Stair training;Gait training;Ultrasound;Traction;Moist Heat;Therapeutic activities;Therapeutic exercise;Balance training;Neuromuscular re-education;Patient/family  education;Passive range of motion;Manual techniques;Dry needling;Taping   PT Next Visit Plan ultrasound PRN, look at inserts   PT Home Exercise Plan long sitting DF stretch, ankle 4 way; short foot, toe yoga, bridge with clam, clamshells; inversion edge of bed, sidelying hip abd   Consulted and Agree with Plan of Care Patient      Patient will benefit from skilled therapeutic intervention in order to improve the following deficits and impairments:  Decreased range of motion, Difficulty walking, Increased muscle spasms, Decreased activity tolerance, Pain, Impaired flexibility, Improper body mechanics, Decreased mobility, Decreased strength  Visit Diagnosis: Pain in right ankle and joints of right foot  Pain in left ankle and joints of left foot  Muscle weakness (generalized)  Difficulty in walking, not elsewhere classified     Problem List There are no active problems to display for this patient.   Bradley Bostelman Veronica Bradley PT, DPT 06/06/17 10:12 AM   CBrookside VillageCPrairie Saint [facility]'S[address] [address]Phone: [phone]  Fax:  [phone] Name: Veronica EckmannMRN: [id number] of Birth: 106/11/1990

## 2017-06-10 ENCOUNTER — Ambulatory Visit: Payer: Medicaid Other | Admitting: Physical Therapy

## 2017-06-10 DIAGNOSIS — M25572 Pain in left ankle and joints of left foot: Secondary | ICD-10-CM

## 2017-06-10 DIAGNOSIS — M25571 Pain in right ankle and joints of right foot: Secondary | ICD-10-CM | POA: Diagnosis not present

## 2017-06-10 DIAGNOSIS — M6281 Muscle weakness (generalized): Secondary | ICD-10-CM

## 2017-06-10 DIAGNOSIS — R262 Difficulty in walking, not elsewhere classified: Secondary | ICD-10-CM

## 2017-06-11 NOTE — Therapy (Signed)
[place], [place], [address] Phone: [phone]   Fax:  [phone]  Physical Therapy Treatment  Patient Details  Name: Veronica Bradley MRN: [id number] Date of Birth: Jul 15, 1991 Referring Provider: Rubbie Battiest, NP  Encounter Date: 06/10/2017      PT End of Session - 06/10/17 1023    Visit Number 7   Number of Visits 11   Date for PT Re-Evaluation 06/14/17   Authorization Type MCD to self pay   PT Start Time 1019   PT Stop Time 1057   PT Time Calculation (min) 38 min      No past medical history on file.  Past Surgical History:  Procedure Laterality Date  . DILATION AND CURETTAGE OF UTERUS     X3  . WISDOM TOOTH EXTRACTION      There were no vitals filed for this visit.      Subjective Assessment - 06/10/17 1021    Subjective Ankles have been doing a little better.    Pain Score 0-No pain   Pain Location Ankle   Pain Orientation Right;Left   Pain Descriptors / Indicators Throbbing;Aching   Pain Type Chronic pain   Aggravating Factors  afternoon after finish errands   Pain Relieving Factors epsom salt                          [facility] Adult PT Treatment/Exercise - 06/11/17 0001      Ankle Exercises: Aerobic   Stationary Bike 5 min L3     Ankle Exercises: Standing   SLS level surface 57 sec Lt, 45 rt ,  unsteady surface 14 Lt, 20 Rt    Heel Raises 10 reps  single leg      Ankle Exercises: Seated   Towel Crunch 4 reps     Ankle Exercises: Stretches   Gastroc Stretch 2 reps;30 seconds   Gastroc Stretch Limitations runners stretch      Ankle Exercises: Sidelying   Ankle Inversion 20 reps;10 reps;Both   Other Sidelying Ankle Exercises hip abduction 2# x30 each     Ankle Exercises: Supine   Other Supine Ankle Exercises bridge with clam-blue band                     PT Long Term Goals - 06/10/17 1037      PT LONG TERM GOAL #1   Title  DF to 10 deg bilat for necessary ROM through stance phase of gait   Baseline passive Lt 14, Rt 12    Time 4   Period Weeks   Status Achieved     PT LONG TERM GOAL #2   Title Gross ankle and hip strength to 5/5 bilat for appropraite stability to biomechanical chain   Baseline see flowsheet   Time 4   Period Weeks   Status Partially Met     PT LONG TERM GOAL #3   Title Pt will demo good control of SLS balance on steady and unsteady surfaces for support to ankle on variable surfaces while walking   Baseline good stability bil on level surface,    Time 4   Period Weeks   Status Partially Met     PT LONG TERM GOAL #4   Title Ankle pain <=3/10 at most with activities to decrease pain effects on daily activities   Baseline average with daily activities 5/10   Status On-going  PT LONG TERM GOAL #5   Title Pt will be independent with HEP for long term care and strengthening at ankles   Baseline is doing exercises and will progress as approprriate   Time 4   Period Weeks   Status On-going               Plan - 06/10/17 1030    Clinical Impression Statement Decreased average pain to 5/10 which happens in the afternoon after performing ADLs. Her DF has improved bilateral. Her ankle strength is grossly 4+/5 except PF which is 4/5, performing 10/25 single leg heel raises each side. Level surface stability improving with continued difficulty on unlevel surfaces. LTG# 1 met. We began standing ankle strength and continued hip strength without c/o pain.    PT Next Visit Plan ultrasound PRN, look at inserts if she remembers    PT Home Exercise Plan long sitting DF stretch, ankle 4 way; short foot, toe yoga, bridge with clam, clamshells; inversion edge of bed, sidelying hip abd   Consulted and Agree with Plan of Care Patient      Patient will benefit from skilled therapeutic intervention in order to improve the following deficits and impairments:  Decreased range of motion, Difficulty  walking, Increased muscle spasms, Decreased activity tolerance, Pain, Impaired flexibility, Improper body mechanics, Decreased mobility, Decreased strength  Visit Diagnosis: Pain in right ankle and joints of right foot  Pain in left ankle and joints of left foot  Muscle weakness (generalized)  Difficulty in walking, not elsewhere classified     Problem List There are no active problems to display for this patient.   Dorene Ar, [place] 06/11/2017, 8:29 AM  [facility] [address] [place], [place], [address] Phone: [phone]   Fax:  [phone]  Name: Veronica Bradley MRN: [id number] Date of Birth: [birth year]

## 2017-06-13 ENCOUNTER — Ambulatory Visit: Payer: Medicaid Other | Admitting: Physical Therapy

## 2017-06-18 ENCOUNTER — Ambulatory Visit: Payer: Medicaid Other | Admitting: Physical Therapy

## 2017-06-18 ENCOUNTER — Encounter: Payer: Self-pay | Admitting: Physical Therapy

## 2017-06-18 DIAGNOSIS — R262 Difficulty in walking, not elsewhere classified: Secondary | ICD-10-CM

## 2017-06-18 DIAGNOSIS — M25571 Pain in right ankle and joints of right foot: Secondary | ICD-10-CM | POA: Diagnosis not present

## 2017-06-18 DIAGNOSIS — M6281 Muscle weakness (generalized): Secondary | ICD-10-CM

## 2017-06-18 DIAGNOSIS — M25572 Pain in left ankle and joints of left foot: Secondary | ICD-10-CM

## 2017-06-18 NOTE — Therapy (Addendum)
Veronica Bradley, [place], [address] Phone: [phone]   Fax:  [phone]  Physical Therapy Treatment/Discharge Summary  Patient Details  Name: Veronica Bradley MRN: [id number] Date of Birth: March 08, 1991 Referring Provider: Rubbie Battiest, NP  Encounter Date: 06/18/2017      PT End of Session - 06/18/17 1546    Visit Number 8   Number of Visits 12   Date for PT Re-Evaluation 07/05/17   Authorization Type MCD to self pay   PT Start Time 1549   PT Stop Time 1629   PT Time Calculation (min) 40 min   Activity Tolerance Patient tolerated treatment well   Behavior During Therapy [facility] for tasks assessed/performed      History reviewed. No pertinent past medical history.  Past Surgical History:  Procedure Laterality Date  . DILATION AND CURETTAGE OF UTERUS     X3  . WISDOM TOOTH EXTRACTION      There were no vitals filed for this visit.      Subjective Assessment - 06/18/17 1549    Subjective reports ankles are feeling a lot better. 3-4 days ago had some pain while standing in kitchen.    Patient Stated Goals decrease pain, ankle stability, increase strength   Currently in Pain? No/denies            Veronica Bradley PT Assessment - 06/18/17 0001      Assessment   Medical Diagnosis bilateral ankle pain   Referring Provider Rubbie Battiest, NP     Strength   Right Hip Extension 5/5   Right Hip ABduction 5/5   Left Hip ABduction 5/5   Right Ankle Dorsiflexion 5/5   Right Ankle Plantar Flexion --  18/25   Right Ankle Inversion 5/5   Right Ankle Eversion 5/5   Left Ankle Dorsiflexion 5/5   Left Ankle Plantar Flexion --  16/25   Left Ankle Inversion 5/5   Left Ankle Eversion 5/5                     [facility] Adult PT Treatment/Exercise - 06/18/17 0001      Ankle Exercises: Standing   SLS solid surface chops   Heel Raises 20 reps   Other Standing Ankle Exercises airex-  marching & SLS     Ankle Exercises: Supine   Other Supine Ankle Exercises planks     Ankle Exercises: Sidelying   Other Sidelying Ankle Exercises hip abd at 90/90 with knee ext                PT Education - 06/18/17 1724    Education provided Yes   Education Details discussion of goals, POC, HEP, exercise form/rationale   Person(s) Educated Patient   Methods Explanation;Demonstration;Tactile cues;Verbal cues   Comprehension Verbalized understanding;Returned demonstration;Verbal cues required;Tactile cues required;Need further instruction             PT Long Term Goals - 06/18/17 1555      PT LONG TERM GOAL #1   Title DF to 10 deg bilat for necessary ROM through stance phase of gait   Baseline passive Lt 14, Rt 12    Status Achieved     PT LONG TERM GOAL #2   Title Gross ankle and hip strength to 5/5 bilat for appropraite stability to biomechanical chain   Baseline see flowsheet, lacking in bilat ankle PF    Status Partially Met     PT LONG TERM GOAL #3  Title Pt will demo good control of SLS balance on steady and unsteady surfaces for support to ankle on variable surfaces while walking   Baseline good stability on solid surface, able to maintain SLS but is wobbly on unstable surface   Status Partially Met     PT LONG TERM GOAL #4   Title Ankle pain <=3/10 at most with activities to decrease pain effects on daily activities   Baseline 4/10   Status On-going     PT LONG TERM GOAL #5   Title Pt will be independent with HEP for long term care and strengthening at ankles   Baseline is doing exercises and will progress as approprriate   Status On-going               Plan - 06/18/17 1630    Clinical Impression Statement Pt demo poor core stability in planks and rotational balance exercises which was addressed and pt was able to feel less ankle pain with proper core control. Long term goal date to be extended by 2 weeks and will work on high level  biomechanics so pt is able to complete workouts and strengthen appropraitely   PT Treatment/Interventions ADLs/Self Care Home Management;Cryotherapy;Electrical Stimulation;Iontophoresis 54m/ml Dexamethasone;Functional mobility training;Stair training;Gait training;Ultrasound;Traction;Moist Heat;Therapeutic activities;Therapeutic exercise;Balance training;Neuromuscular re-education;Patient/family education;Passive range of motion;Manual techniques;Dry needling;Taping   PT Next Visit Plan review planks, SLS/tandem with rotational challenges, squats, lunges, hops   PT Home Exercise Plan long sitting DF stretch, ankle 4 way; short foot, toe yoga, bridge with clam, clamshells; inversion edge of bed, sidelying hip abd   Consulted and Agree with Plan of Care Patient      Patient will benefit from skilled therapeutic intervention in order to improve the following deficits and impairments:  Decreased range of motion, Difficulty walking, Increased muscle spasms, Decreased activity tolerance, Pain, Impaired flexibility, Improper body mechanics, Decreased mobility, Decreased strength  Visit Diagnosis: Pain in right ankle and joints of right foot - Plan: PT plan of care cert/re-cert  Pain in left ankle and joints of left foot - Plan: PT plan of care cert/re-cert  Muscle weakness (generalized) - Plan: PT plan of care cert/re-cert  Difficulty in walking, not elsewhere classified - Plan: PT plan of care cert/re-cert     Problem List There are no active problems to display for this patient.   Veronica Bradley Veronica Bradley PT, Veronica Bradley 06/18/17 5:26 PM   [facility] [address]Phone: [phone]  Fax:  [phone] Name: CEsmay AmspacherMRN: [id number] of Birth: 1Aug 13, 1992 PHYSICAL THERAPY DISCHARGE SUMMARY  Visits from Start of Care: 8  Current functional level related to goals / functional outcomes: See above    Remaining deficits: See above   Education / Equipment: Anatomy of condition, poc, hep, exercise form/rationale  Plan: Patient agrees to discharge.  Patient goals were partially met. Patient is being discharged due to the patient's request.  ?????   Pt chose to not schedule any more appointments.    Veronica Bradley C. Veronica Bradley PT, Veronica Bradley 07/24/17 5:07 PM

## 2017-07-06 ENCOUNTER — Emergency Department (HOSPITAL_COMMUNITY)
Admission: EM | Admit: 2017-07-06 | Discharge: 2017-07-06 | Disposition: A | Discharge status: Home / Self Care | Payer: Medicaid Other | Attending: Emergency Medicine | Admitting: Emergency Medicine

## 2017-07-06 ENCOUNTER — Encounter (HOSPITAL_COMMUNITY): Payer: Self-pay | Admitting: Emergency Medicine

## 2017-07-06 ENCOUNTER — Emergency Department (HOSPITAL_COMMUNITY): Payer: Medicaid Other

## 2017-07-06 DIAGNOSIS — W19XXXA Unspecified fall, initial encounter: Secondary | ICD-10-CM | POA: Diagnosis not present

## 2017-07-06 DIAGNOSIS — Z79899 Other long term (current) drug therapy: Secondary | ICD-10-CM | POA: Diagnosis not present

## 2017-07-06 DIAGNOSIS — S8012XA Contusion of left lower leg, initial encounter: Secondary | ICD-10-CM | POA: Insufficient documentation

## 2017-07-06 DIAGNOSIS — Y939 Activity, unspecified: Secondary | ICD-10-CM | POA: Insufficient documentation

## 2017-07-06 DIAGNOSIS — Y929 Unspecified place or not applicable: Secondary | ICD-10-CM | POA: Diagnosis not present

## 2017-07-06 DIAGNOSIS — Y999 Unspecified external cause status: Secondary | ICD-10-CM | POA: Diagnosis not present

## 2017-07-06 MED ORDER — HYDROCODONE-ACETAMINOPHEN 5-325 MG PO TABS
1.0000 | ORAL_TABLET | Freq: Once | ORAL | Status: AC
  Administered 2017-07-06: 1 via ORAL
  Filled 2017-07-06: qty 1

## 2017-07-06 MED ORDER — DICLOFENAC SODIUM 50 MG PO TBEC: 50.0000 mg | DELAYED_RELEASE_TABLET | Freq: Two times a day (BID) | ORAL | 0 refills | Status: DC

## 2017-07-06 NOTE — ED Provider Notes (Signed)
[facility]-EMERGENCY DEPT Provider Note   CSN: [id number] Arrival date & time: 07/06/17  1155     History   Chief Complaint Chief Complaint  Patient presents with  . Leg Injury    HPI Veronica Bradley is a 26 y.o. female who presents to the ED with left lower leg pain after falling outside in grass. Patent states she is unable to bear weight on the leg.  The history is provided by the patient. No language interpreter was used.  Fall  This is a new problem. The current episode started 3 to 5 hours ago. The problem has not changed since onset.The symptoms are aggravated by standing. Nothing relieves the symptoms. She has tried nothing for the symptoms.    No past medical history on file.  There are no active problems to display for this patient.   Past Surgical History:  Procedure Laterality Date  . DILATION AND CURETTAGE OF UTERUS     X3  . WISDOM TOOTH EXTRACTION      OB History    Gravida Para Term Preterm AB Living   5 1 1   3      SAB TAB Ectopic Multiple Live Births     3             Home Medications    Prior to Admission medications   Medication Sig Start Date End Date Taking? Authorizing Provider  acetaminophen (TYLENOL) 500 MG tablet Take 1,000 mg by mouth daily as needed for mild pain.    [provider]  diclofenac (VOLTAREN) 50 MG EC tablet Take 1 tablet (50 mg total) 2 (two) times daily by mouth. 07/06/17   Janne NapoleonNeese, Hope M, NP  Multiple Vitamins-Minerals (HAIR SKIN NAILS) CAPS Take 1 capsule by mouth daily.    [provider]    Family History No family history on file.  Social History Social History   Tobacco Use  . Smoking status: Never Smoker  . Smokeless tobacco: Current User  Substance Use Topics  . Alcohol use: Yes    Alcohol/week: 0.6 oz    Types: 1 Glasses of wine per week  . Drug use: No     Allergies   Patient has no known allergies.   Review of Systems Review of Systems    Musculoskeletal: Positive for arthralgias.       Left lower leg pain  Skin: Negative for wound.  All other systems reviewed and are negative.    Physical Exam Updated Vital Signs BP 126/71 (BP Location: Right Arm)   Pulse 77   Temp 98 F (36.7 C) (Oral)   Resp 17   LMP 06/30/2017 (Approximate)   SpO2 100%   Breastfeeding? Unknown   Physical Exam  Constitutional: She appears well-developed and well-nourished. No distress.  HENT:  Head: Normocephalic and atraumatic.  Eyes: EOM are normal.  Neck: Neck supple.  Cardiovascular: Normal rate.  Pulmonary/Chest: Effort normal.  Musculoskeletal:       Left ankle: She exhibits normal range of motion, no swelling, no ecchymosis, no deformity, no laceration and normal pulse. Tenderness (mild). Lateral malleolus tenderness found. Achilles tendon normal.       Left lower leg: She exhibits tenderness. She exhibits no deformity and no laceration. Swelling: minimal.       Legs: Pain over the lower anterior leg. No ecchymosis noted. Pedal pulses 2+. No tenderness to the left knee. There is tenderness of the left ankle with palpation and rang of motion. Dorsiflexion and  plantar flexion of the left foot without difficulty  Neurological: She is alert.  Skin: Skin is warm and dry.  Psychiatric: She has a normal mood and affect. Her behavior is normal.  Nursing note and vitals reviewed.    ED Treatments / Results  Labs (all labs ordered are listed, but only abnormal results are displayed) Labs Reviewed - No data to display  Radiology Dg Tibia/fibula Left  Result Date: 07/06/2017 CLINICAL DATA:  Pain to ankle joint after twisting/trauma. EXAM: LEFT TIBIA AND FIBULA - 2 VIEW COMPARISON:  March 11, 2017 FINDINGS: The distal femur is normal in appearance. The visualize fibula is normal. There is a defect in the anteromedial tibia best seen on the lateral view which is likely from previous trauma. This is unchanged since July of 2018 and not an  acute finding. There is mixed lucency and sclerosis along the talar dome based on the AP view, not seen on the lateral view. No other bony abnormalities identified. No soft tissue swelling noted. IMPRESSION: 1. There is a defect in the anteromedial tibia best seen on the lateral view which is unchanged since July of 2018 and not an acute finding. There also appears to be mixed sclerosis and lucency in the talar dome, not well appreciated on the July 2018 comparison. Dedicated images of the ankle may better evaluate. No other abnormalities. Electronically Signed   By: Gerome Samavid  Williams III M.D   On: 07/06/2017 12:37   Dg Ankle Complete Left  Result Date: 07/06/2017 CLINICAL DATA:  Ankle pain after fall. EXAM: LEFT ANKLE COMPLETE - 3+ VIEW COMPARISON:  Left ankle x-rays dated March 03, 2016. FINDINGS: No acute fracture or malalignment. Lucency and irregularity of the medial talar dome. No joint effusion. Bone mineralization is normal. Soft tissues are unremarkable. IMPRESSION: 1. No acute fracture. 2. Lucency and irregularity of the medial talar dome, suspicious for osteochondral lesion. Nonemergent ankle MRI without contrast can be obtained for further evaluation. Electronically Signed   By: Obie DredgeWilliam T Derry M.D.   On: 07/06/2017 13:28    Procedures Procedures (including critical care time)  Medications Ordered in ED Medications  HYDROcodone-acetaminophen (NORCO/VICODIN) 5-325 MG per tablet 1 tablet (1 tablet Oral Given 07/06/17 1308)     Initial Impression / Assessment and Plan / ED Course  I have reviewed the triage vital signs and the nursing notes. 26 y.o. female with pain to the left lower leg s/p fall stable for d/c without focal neuro deficits and no acute fracture or dislocation noted on x-ray. Discussed with the patient finding of lesion on the bone and need for f/u with ortho. Patient agrees with plan. Pain management and ace wrap, ice, crutches.   Final Clinical Impressions(s) / ED  Diagnoses   Final diagnoses:  Contusion of left lower extremity, initial encounter    ED Discharge Orders        Ordered    diclofenac (VOLTAREN) 50 MG EC tablet  2 times daily     07/06/17 [address]       Neese, [place]atesHope M, [place]NP 07/06/17 1352    Pricilla LovelessGoldston, Scott, MD 07/07/17 [phone]

## 2017-07-06 NOTE — Discharge Instructions (Signed)
Your x-ray today shows no fracture or dislocation. It does however, show a lesion in the tibia (bone in your lower leg) but it has not changed since July 2018. There is also a lesion seen in the left ankle. You should follow up with the orthopedic doctor for further evaluation and possible MRI of the area.

## 2017-07-06 NOTE — ED Triage Notes (Signed)
Per [facility] pt from home c/o left shin/lower leg pain after fall outside in grass. Pt states she cannot bear any weight onto leg.

## 2018-08-20 HISTORY — PX: OTHER SURGICAL HISTORY: SHX169

## 2019-03-25 ENCOUNTER — Other Ambulatory Visit: Payer: Self-pay

## 2019-03-25 ENCOUNTER — Emergency Department (HOSPITAL_COMMUNITY)
Admission: EM | Admit: 2019-03-25 | Discharge: 2019-03-25 | Disposition: A | Discharge status: Home / Self Care | Payer: Medicaid Other | Attending: Emergency Medicine | Admitting: Emergency Medicine

## 2019-03-25 ENCOUNTER — Encounter (HOSPITAL_COMMUNITY): Payer: Self-pay | Admitting: Emergency Medicine

## 2019-03-25 DIAGNOSIS — R21 Rash and other nonspecific skin eruption: Secondary | ICD-10-CM | POA: Diagnosis present

## 2019-03-25 DIAGNOSIS — L298 Other pruritus: Secondary | ICD-10-CM | POA: Diagnosis not present

## 2019-03-25 DIAGNOSIS — L299 Pruritus, unspecified: Secondary | ICD-10-CM

## 2019-03-25 MED ORDER — PERMETHRIN 5 % EX CREA: TOPICAL_CREAM | CUTANEOUS | 0 refills | Status: DC

## 2019-03-25 MED ORDER — DIPHENHYDRAMINE HCL 25 MG PO TABS: 25.0000 mg | ORAL_TABLET | Freq: Four times a day (QID) | ORAL | 0 refills | Status: DC | PRN

## 2019-03-25 NOTE — Discharge Instructions (Addendum)
You were seen in the emergency department today for possible bug bites with itching.  Your exam was reassuring.  We are providing you with permethrin cream to use for possible lice or scabies, apply this once and follow directions accordingly.   We are sending home with Benadryl to take as needed for itching.  We have prescribed you new medication(s) today. Discuss the medications prescribed today with your pharmacist as they can have adverse effects and interactions with your other medicines including over the counter and prescribed medications. Seek medical evaluation if you start to experience new or abnormal symptoms after taking one of these medicines, seek care immediately if you start to experience difficulty breathing, feeling of your throat closing, facial swelling, or rash as these could be indications of a more serious allergic reaction  Please be sure to wash all household sheets/bedding in hot water.  Please follow-up with primary care within 3 days for reassessment of your rash.  Return to the ER for new or worsening symptoms including but not limited to fever, trouble breathing, swelling of the face, sensation of throat closing, spreading rash, or any other concerns.

## 2019-03-25 NOTE — ED Provider Notes (Signed)
[facility] DEPT Provider Note   CSN: [id number] Arrival date & time: 03/25/19  0023     History   Chief Complaint Chief Complaint  Patient presents with  . Rash    HPI Veronica Bradley is a 28 y.o. female without significant past medical history who presents to the emergency department with complaints of rash x4 days.  Patient states that for the past 4 days she is intermittently felt like things are biting her all over her body leaving small red marks which quickly resolve.  She states that these areas are pruritic.  No alleviating or rating factors.  No intervention prior to arrival.  No new environments or products.  She has not slept anywhere new, but does state that a friend slept over.  Denies fever, chills, trouble swallowing, shortness of breath, wheezing, feeling of throat closing, vomiting, or abdominal pain.  No known tick exposures or time spent in wooded areas.     HPI  History reviewed. No pertinent past medical history.  There are no active problems to display for this patient.   Past Surgical History:  Procedure Laterality Date  . DILATION AND CURETTAGE OF UTERUS     X3  . WISDOM TOOTH EXTRACTION       OB History    Gravida  5   Para  1   Term  1   Preterm      AB  3   Living        SAB      TAB  3   Ectopic      Multiple      Live Births               Home Medications    Prior to Admission medications   Medication Sig Start Date End Date Taking? Authorizing Provider  acetaminophen (TYLENOL) 500 MG tablet Take 1,000 mg by mouth daily as needed for mild pain.    [provider]  diclofenac (VOLTAREN) 50 MG EC tablet Take 1 tablet (50 mg total) 2 (two) times daily by mouth. 07/06/17   Ashley Murrain, NP  Multiple Vitamins-Minerals (HAIR SKIN NAILS) CAPS Take 1 capsule by mouth daily.    [provider]    Family History No family history on file.  Social History Social History    Tobacco Use  . Smoking status: Never Smoker  . Smokeless tobacco: Current User  Substance Use Topics  . Alcohol use: Yes    Alcohol/week: 1.0 standard drinks    Types: 1 Glasses of wine per week  . Drug use: No     Allergies   Patient has no known allergies.   Review of Systems Review of Systems  Constitutional: Negative for chills and fever.  HENT: Negative for congestion, trouble swallowing and voice change.   Respiratory: Negative for shortness of breath and wheezing.   Cardiovascular: Negative for chest pain.  Gastrointestinal: Negative for abdominal pain and vomiting.  Skin: Positive for rash.  Neurological: Negative for syncope.     Physical Exam Updated Vital Signs BP (!) 109/52 (BP Location: Right Arm)   Pulse 81   Temp 98.3 F (36.8 C) (Oral)   Resp 19   Ht 5' (1.524 m)   Wt 79.4 kg   SpO2 100%   BMI 34.18 kg/m   Physical Exam Vitals signs and nursing note reviewed.  Constitutional:      General: She is not in acute distress.    Appearance:  She is well-developed. She is not toxic-appearing.  HENT:     Head: Normocephalic and atraumatic.     Mouth/Throat:     Mouth: Mucous membranes are moist.     Pharynx: No oropharyngeal exudate or posterior oropharyngeal erythema.     Comments: Posterior oropharynx is symmetric appearing. Patient tolerating own secretions without difficulty. No trismus. No drooling. No hot potato voice. No swelling beneath the tongue, submandibular compartment is soft.  No angioedema.  Airway is patent. Eyes:     General:        Right eye: No discharge.        Left eye: No discharge.     Conjunctiva/sclera: Conjunctivae normal.  Neck:     Musculoskeletal: Neck supple.  Cardiovascular:     Rate and Rhythm: Normal rate and regular rhythm.  Pulmonary:     Effort: Pulmonary effort is normal. No respiratory distress.     Breath sounds: Normal breath sounds. No stridor. No wheezing, rhonchi or rales.  Abdominal:     General: There  is no distension.     Palpations: Abdomen is soft.     Tenderness: There is no abdominal tenderness. There is no guarding or rebound.  Skin:    General: Skin is warm and dry.     Findings: No abrasion, abscess, bruising, ecchymosis, erythema, signs of injury, laceration, lesion, petechiae, rash or wound.     Comments: Patient without appreciable rash to skin. No abnormal findings on scalp exam either.  No abnormal lesions to palms/soles.   Neurological:     Mental Status: She is alert.     Comments: Clear speech.   Psychiatric:        Behavior: Behavior normal.    ED Treatments / Results  Labs (all labs ordered are listed, but only abnormal results are displayed) Labs Reviewed - No data to display  EKG None  Radiology No results found.  Procedures Procedures (including critical care time)  Medications Ordered in ED Medications - No data to display   Initial Impression / Assessment and Plan / ED Course  I have reviewed the triage vital signs and the nursing notes.  Pertinent labs & imaging results that were available during my care of the patient were reviewed by me and considered in my medical decision making (see chart for details).   Patient presents to the emergency department with concern for intermittent bug bites with pruritus.  Nontoxic-appearing, no apparent distress, vitals without significant abnormality.  Patient has an overall benign physical exam.  No angioedema, airway is patent, no respiratory distress.  No fever or tic exposure. No appreciable rash on exam. No blisters, no pustules, no warmth, no draining sinus tracts, no superficial abscesses, no bullous impetigo, no vesicles, no desquamation, no target lesions with dusky purpura or a central bulla. Not tender to touch. No concern for superimposed infection. No concern for SJS, TEN, TSS, tick borne illness, syphilis or other life-threatening condition.  Unclear definitive etiology of her symptoms.   Give  prescription for Benadryl, will also try permethrin for possible lice/scabies given her concerns- however exam not particularly consistent with either of these diagnoses. Discussed washing bedding in hot water.  I discussed treatment plan, need for follow-up, and return precautions with the patient. Provided opportunity for questions, patient confirmed understanding and is in agreement with plan.    Final Clinical Impressions(s) / ED Diagnoses   Final diagnoses:  Pruritus    ED Discharge Orders  Ordered    diphenhydrAMINE (BENADRYL) 25 MG tablet  Every 6 hours PRN     03/25/19 0210    permethrin (ELIMITE) 5 % cream     03/25/19 0210           Tressia Labrum, PardeevilleSamantha R, PA-C 03/25/19 [id number]    Ward, Layla MawKristen N, DO 03/25/19 0215

## 2019-03-25 NOTE — ED Triage Notes (Signed)
Patient here from home with complaints of itching all over. Reports "I keep getting bit by something but I don't know what it is".

## 2019-07-15 ENCOUNTER — Emergency Department (HOSPITAL_COMMUNITY)
Admission: EM | Admit: 2019-07-15 | Discharge: 2019-07-15 | Disposition: A | Discharge status: Home / Self Care | Payer: Medicaid Other | Attending: Emergency Medicine | Admitting: Emergency Medicine

## 2019-07-15 ENCOUNTER — Other Ambulatory Visit: Payer: Self-pay

## 2019-07-15 ENCOUNTER — Encounter (HOSPITAL_COMMUNITY): Payer: Self-pay

## 2019-07-15 DIAGNOSIS — N898 Other specified noninflammatory disorders of vagina: Secondary | ICD-10-CM | POA: Diagnosis present

## 2019-07-15 DIAGNOSIS — Z79899 Other long term (current) drug therapy: Secondary | ICD-10-CM | POA: Diagnosis not present

## 2019-07-15 DIAGNOSIS — Z202 Contact with and (suspected) exposure to infections with a predominantly sexual mode of transmission: Secondary | ICD-10-CM | POA: Diagnosis not present

## 2019-07-15 LAB — URINALYSIS, ROUTINE W REFLEX MICROSCOPIC
Bilirubin Urine: NEGATIVE
Glucose, UA: NEGATIVE mg/dL
Hgb urine dipstick: NEGATIVE
Ketones, ur: NEGATIVE mg/dL
Leukocytes,Ua: NEGATIVE
Nitrite: NEGATIVE
Protein, ur: NEGATIVE mg/dL
Specific Gravity, Urine: 1.017 (ref 1.005–1.030)
pH: 6 (ref 5.0–8.0)

## 2019-07-15 LAB — WET PREP, GENITAL
Clue Cells Wet Prep HPF POC: NONE SEEN
Sperm: NONE SEEN
Trich, Wet Prep: NONE SEEN
Yeast Wet Prep HPF POC: NONE SEEN

## 2019-07-15 LAB — HIV ANTIBODY (ROUTINE TESTING W REFLEX): HIV Screen 4th Generation wRfx: NONREACTIVE

## 2019-07-15 LAB — I-STAT BETA HCG BLOOD, ED (MC, WL, AP ONLY): I-stat hCG, quantitative: <5 m[IU]/mL (ref <5)

## 2019-07-15 LAB — RPR: RPR Ser Ql: NONREACTIVE

## 2019-07-15 MED ORDER — AZITHROMYCIN 250 MG PO TABS
1000.0000 mg | ORAL_TABLET | Freq: Once | ORAL | Status: AC
Start: 2019-07-15 — End: 2019-07-15
  Administered 2019-07-15: 1000 mg via ORAL
  Filled 2019-07-15: qty 4

## 2019-07-15 MED ORDER — CEFTRIAXONE SODIUM 250 MG IJ SOLR
250.0000 mg | Freq: Once | INTRAMUSCULAR | Status: AC
  Administered 2019-07-15: 250 mg via INTRAMUSCULAR
  Filled 2019-07-15: qty 250

## 2019-07-15 MED ORDER — STERILE WATER FOR INJECTION IJ SOLN
INTRAMUSCULAR | Status: AC
  Administered 2019-07-15: 10 mL
  Filled 2019-07-15: qty 10

## 2019-07-15 NOTE — ED Triage Notes (Signed)
Pt states that she had sex with someone that told her she needed to get tested. Pt states that she used protection ,but the condom broke. Pt endorses vaginal itching and foul odor. LMP 06/04/19, so pt is requesting preg test.

## 2019-07-15 NOTE — ED Provider Notes (Signed)
[facility] DEPT Provider Note   CSN: [id number] Arrival date & time: 07/15/19  0746     History   Chief Complaint Chief Complaint  Patient presents with  . Exposure to STD    HPI Veronica Bradley is a 28 y.o. female.     The history is provided by the patient. No language interpreter was used.  Exposure to STD     28 year old female presenting concerning for potential STI exposure.  Patient report approximately 4-5 days ago she had sexual intercourse with a new partner when the condom broke.  Since then she noticed some vaginal discharge with odor which concerns her.  She does not complain of any abdominal pain no dysuria no fever chills.  She report remote history of STI.  She report last menstrual period was November 13th.  She would like to be tested for STI.  No other complaint.  No specific treatment tried.  History reviewed. No pertinent past medical history.  There are no active problems to display for this patient.   Past Surgical History:  Procedure Laterality Date  . DILATION AND CURETTAGE OF UTERUS     X3  . WISDOM TOOTH EXTRACTION       OB History    Gravida  5   Para  1   Term  1   Preterm      AB  3   Living        SAB      TAB  3   Ectopic      Multiple      Live Births               Home Medications    Prior to Admission medications   Medication Sig Start Date End Date Taking? Authorizing Provider  acetaminophen (TYLENOL) 500 MG tablet Take 1,000 mg by mouth daily as needed for mild pain.    [provider]  diclofenac (VOLTAREN) 50 MG EC tablet Take 1 tablet (50 mg total) 2 (two) times daily by mouth. 07/06/17   Ashley Murrain, NP  diphenhydrAMINE (BENADRYL) 25 MG tablet Take 1 tablet (25 mg total) by mouth every 6 (six) hours as needed. 03/25/19   Petrucelli, Samantha R, PA-C  Multiple Vitamins-Minerals (HAIR SKIN NAILS) CAPS Take 1 capsule by mouth daily.    [provider]   permethrin (ELIMITE) 5 % cream Thoroughly massage cream from head to soles of feet; leave on for 8 to 14 hours before removing (shower or bath), do not apply to face. 03/25/19   Petrucelli, Glynda Jaeger, PA-C    Family History History reviewed. No pertinent family history.  Social History Social History   Tobacco Use  . Smoking status: Never Smoker  . Smokeless tobacco: Current User  Substance Use Topics  . Alcohol use: Yes    Alcohol/week: 1.0 standard drinks    Types: 1 Glasses of wine per week  . Drug use: No     Allergies   Patient has no known allergies.   Review of Systems Review of Systems  All other systems reviewed and are negative.    Physical Exam Updated Vital Signs BP 116/79   Pulse 78   Temp 97.9 F (36.6 C) (Oral)   Resp 16   LMP 06/04/2019   SpO2 96%   Physical Exam Vitals signs and nursing note reviewed.  Constitutional:      General: She is not in acute distress.    Appearance: She is  well-developed.  HENT:     Head: Atraumatic.  Eyes:     Conjunctiva/sclera: Conjunctivae normal.  Neck:     Musculoskeletal: Neck supple.  Abdominal:     Palpations: Abdomen is soft.     Tenderness: There is no abdominal tenderness.  Genitourinary:    Comments: Chaperone present during exam.  No inguinal lymphadenopathy or inguinal hernia noted.  Normal external genitalia.  No significant discomfort with speculum insertion.  Normal vaginal vault with small amount of vaginal discharge noted.  Cervical os is closed and normal.  On bimanual examination no adnexal tenderness or cervical motion tenderness. Skin:    Findings: No rash.  Neurological:     Mental Status: She is alert.      ED Treatments / Results  Labs (all labs ordered are listed, but only abnormal results are displayed) Labs Reviewed  WET PREP, GENITAL - Abnormal; Notable for the following components:      Result Value   WBC, Wet Prep HPF POC FEW (*)    All other components within normal  limits  RPR  URINALYSIS, ROUTINE W REFLEX MICROSCOPIC  HIV ANTIBODY (ROUTINE TESTING W REFLEX)  I-STAT BETA HCG BLOOD, ED ([facility], [facility], [facility] ONLY)  GC/CHLAMYDIA PROBE AMP () NOT AT [facility]    EKG None  Radiology No results found.  Procedures Procedures (including critical care time)  Medications Ordered in ED Medications  cefTRIAXone (ROCEPHIN) injection 250 mg (250 mg Intramuscular Given 07/15/19 1037)  azithromycin (ZITHROMAX) tablet 1,000 mg (1,000 mg Oral Given 07/15/19 1037)  sterile water (preservative free) injection (10 mLs  Given 07/15/19 1037)     Initial Impression / Assessment and Plan / ED Course  I have reviewed the triage vital signs and the nursing notes.  Pertinent labs & imaging results that were available during my care of the patient were reviewed by me and considered in my medical decision making (see chart for details).        BP 116/79   Pulse 78   Temp 97.9 F (36.6 C) (Oral)   Resp 16   LMP 06/04/2019   SpO2 96%    Final Clinical Impressions(s) / ED Diagnoses   Final diagnoses:  Possible exposure to STD    ED Discharge Orders    None     9:35 AM Patient here with concerns of potential STI after having sexual intercourse with a new partner several days prior.  She requests treatment as well, Rocephin and Zithromax urine, pelvic examination performed.  Will screen for STI.  10:51 AM Wet prep unremarkable, pregnancy test is negative.  Urinalysis have not been collected but patient does not have any urinary symptoms.  At this time she is stable for discharge.  She will be notified if test positive for STI.   Fayrene Helper, PA-C 07/15/19 1052    Curatolo, Adam, DO 07/15/19 1106

## 2019-07-17 LAB — GC/CHLAMYDIA PROBE AMP (~~LOC~~) NOT AT ARMC
Chlamydia: NEGATIVE
Neisseria Gonorrhea: NEGATIVE

## 2020-03-29 ENCOUNTER — Encounter (HOSPITAL_COMMUNITY): Payer: Self-pay

## 2020-03-29 ENCOUNTER — Emergency Department (HOSPITAL_COMMUNITY): Payer: Medicaid Other

## 2020-03-29 ENCOUNTER — Other Ambulatory Visit: Payer: Self-pay

## 2020-03-29 DIAGNOSIS — M791 Myalgia, unspecified site: Secondary | ICD-10-CM | POA: Diagnosis not present

## 2020-03-29 DIAGNOSIS — H9209 Otalgia, unspecified ear: Secondary | ICD-10-CM | POA: Insufficient documentation

## 2020-03-29 DIAGNOSIS — R05 Cough: Secondary | ICD-10-CM | POA: Diagnosis not present

## 2020-03-29 DIAGNOSIS — R509 Fever, unspecified: Secondary | ICD-10-CM | POA: Insufficient documentation

## 2020-03-29 DIAGNOSIS — J069 Acute upper respiratory infection, unspecified: Secondary | ICD-10-CM | POA: Insufficient documentation

## 2020-03-29 DIAGNOSIS — R519 Headache, unspecified: Secondary | ICD-10-CM | POA: Diagnosis present

## 2020-03-29 DIAGNOSIS — U071 COVID-19: Secondary | ICD-10-CM | POA: Diagnosis not present

## 2020-03-29 NOTE — ED Triage Notes (Signed)
Patient arrived stating she has had a headaches and ear pain since Sunday. Woke up today coughing up phlegm and has a runny nose. States took a tylenol yesterday but did not take any today because she felt it was not working.

## 2020-03-30 ENCOUNTER — Emergency Department (HOSPITAL_COMMUNITY)
Admission: EM | Admit: 2020-03-30 | Discharge: 2020-03-30 | Disposition: A | Discharge status: Home / Self Care | Payer: Medicaid Other | Attending: Emergency Medicine | Admitting: Emergency Medicine

## 2020-03-30 DIAGNOSIS — J069 Acute upper respiratory infection, unspecified: Secondary | ICD-10-CM

## 2020-03-30 LAB — SARS CORONAVIRUS 2 BY RT PCR (HOSPITAL ORDER, PERFORMED IN ~~LOC~~ HOSPITAL LAB): SARS Coronavirus 2: POSITIVE — AB

## 2020-03-30 MED ORDER — IBUPROFEN 200 MG PO TABS
600.0000 mg | ORAL_TABLET | Freq: Once | ORAL | Status: AC
  Administered 2020-03-30: 600 mg via ORAL
  Filled 2020-03-30: qty 3

## 2020-03-30 NOTE — ED Provider Notes (Signed)
[facility]-EMERGENCY DEPT Provider Note   CSN: [id number] Arrival date & time: 03/29/20  2051     History Chief Complaint  Patient presents with  . Headache    Veronica Bradley is a 29 y.o. female.  HPI   Patient presents to the emergency department with chief complaint of headache, fever, chills, general body aches for last 3 days.  Patient explains on Sunday she had a slight headache and as the day progressed she developed a fever and then general body aches.  She has been taking Tylenol without any relief.  She admits to subjective fevers and chills, ear pain and a dry cough.  She denies abdominal pain, nausea, vomiting, dysuria, shortness of breath, chest pain.  She has not been vaccinated for COVID-19, admits that one of her friends was recently sick but does not know what she had.  She has no significant medical history, does not take any medication on daily basis.  She denies sore throat, chest pain, shortness of breath, abdominal pain, nausea, vomiting, diarrhea, pedal edema.  History reviewed. No pertinent past medical history.  There are no problems to display for this patient.   Past Surgical History:  Procedure Laterality Date  . DILATION AND CURETTAGE OF UTERUS     X3  . WISDOM TOOTH EXTRACTION       OB History    Gravida  5   Para  1   Term  1   Preterm      AB  3   Living        SAB      TAB  3   Ectopic      Multiple      Live Births              No family history on file.  Social History   Tobacco Use  . Smoking status: Never Smoker  . Smokeless tobacco: Current User  Substance Use Topics  . Alcohol use: Yes    Alcohol/week: 1.0 standard drink    Types: 1 Glasses of wine per week  . Drug use: No    Home Medications Prior to Admission medications   Medication Sig Start Date End Date Taking? Authorizing Provider  diclofenac (VOLTAREN) 50 MG EC tablet Take 1 tablet (50 mg total) 2 (two) times daily by  mouth. Patient not taking: Reported on 07/15/2019 07/06/17   Janne Napoleon, NP  diphenhydrAMINE (BENADRYL) 25 MG tablet Take 1 tablet (25 mg total) by mouth every 6 (six) hours as needed. Patient not taking: Reported on 07/15/2019 03/25/19   Petrucelli, Lelon Mast R, PA-C  Multiple Vitamins-Minerals (HAIR SKIN NAILS) CAPS Take 1 capsule by mouth daily.    [provider]  permethrin (ELIMITE) 5 % cream Thoroughly massage cream from head to soles of feet; leave on for 8 to 14 hours before removing (shower or bath), do not apply to face. Patient not taking: Reported on 07/15/2019 03/25/19   Petrucelli, Lelon Mast R, PA-C    Allergies    Shellfish allergy  Review of Systems   Review of Systems  Constitutional: Positive for chills and fever.  HENT: Positive for ear pain. Negative for congestion, sore throat and trouble swallowing.   Eyes: Negative for visual disturbance.  Respiratory: Positive for cough. Negative for shortness of breath.   Cardiovascular: Negative for chest pain.  Gastrointestinal: Negative for abdominal pain, diarrhea, nausea and vomiting.  Genitourinary: Negative for enuresis, flank pain, vaginal bleeding and vaginal discharge.  Musculoskeletal: Negative for back pain.  Skin: Negative for rash.  Neurological: Negative for dizziness and headaches.  Hematological: Does not bruise/bleed easily.    Physical Exam Updated Vital Signs BP 120/85 (BP Location: Right Arm)   Pulse 97   Temp 98.4 F (36.9 C) (Oral)   Resp 18   LMP 03/18/2020   SpO2 100%   Physical Exam Vitals and nursing note reviewed.  Constitutional:      General: She is not in acute distress.    Appearance: Normal appearance. She is not ill-appearing or diaphoretic.  HENT:     Head: Normocephalic and atraumatic.     Right Ear: Tympanic membrane, ear canal and external ear normal.     Left Ear: Tympanic membrane, ear canal and external ear normal.     Nose: No congestion or rhinorrhea.      Mouth/Throat:     Mouth: Mucous membranes are moist.     Pharynx: No oropharyngeal exudate or posterior oropharyngeal erythema.  Eyes:     General: No visual field deficit or scleral icterus.    Conjunctiva/sclera: Conjunctivae normal.     Pupils: Pupils are equal, round, and reactive to light.  Cardiovascular:     Rate and Rhythm: Normal rate and regular rhythm.     Pulses: Normal pulses.     Heart sounds: No murmur heard.  No friction rub. No gallop.   Pulmonary:     Effort: Pulmonary effort is normal. No respiratory distress.     Breath sounds: No stridor. No wheezing, rhonchi or rales.  Chest:     Chest wall: No tenderness.  Abdominal:     General: There is no distension.     Palpations: Abdomen is soft.     Tenderness: There is no abdominal tenderness. There is no right CVA tenderness, left CVA tenderness or guarding.  Musculoskeletal:        General: No swelling or tenderness.  Skin:    General: Skin is warm and dry.     Capillary Refill: Capillary refill takes less than 2 seconds.     Findings: No rash.  Neurological:     General: No focal deficit present.     Mental Status: She is alert and oriented to person, place, and time.     GCS: GCS eye subscore is 4. GCS verbal subscore is 5. GCS motor subscore is 6.     Cranial Nerves: Cranial nerves are intact. No cranial nerve deficit or facial asymmetry.     Sensory: Sensation is intact. No sensory deficit.     Motor: Motor function is intact. No weakness or pronator drift.     Coordination: Coordination is intact. Romberg sign negative. Finger-Nose-Finger Test and Heel to [facility] Test normal.  Psychiatric:        Mood and Affect: Mood normal.     ED Results / Procedures / Treatments   Labs (all labs ordered are listed, but only abnormal results are displayed) Labs Reviewed  SARS CORONAVIRUS 2 BY RT PCR (HOSPITAL ORDER, PERFORMED IN [facility])    EKG None  Radiology DG Chest 2 View  Result Date:  03/29/2020 CLINICAL DATA:  Cough EXAM: CHEST - 2 VIEW COMPARISON:  None. FINDINGS: The heart size and mediastinal contours are within normal limits. Both lungs are clear. The visualized skeletal structures are unremarkable. IMPRESSION: No active cardiopulmonary disease. Electronically Signed   By: Jonna Clark M.D.   On: 03/29/2020 22:10    Procedures Procedures (including critical  care time)  Medications Ordered in ED Medications  ibuprofen (ADVIL) tablet 600 mg (600 mg Oral Given 03/30/20 9629)    ED Course  I have reviewed the triage vital signs and the nursing notes.  Pertinent labs & imaging results that were available during my care of the patient were reviewed by me and considered in my medical decision making (see chart for details).    MDM Rules/Calculators/A&P                          I have personally reviewed all imaging, labs and have interpreted them.  Patient did not appear to be in any acute distress, she is alert and oriented.  On exam no significant findings, neuro exam performed without neuro deficits, lungs were clear bilaterally, abdominal exam not tenderness to palpation, no acute abdomen seen, no signs of fluid overload or hypoperfusion.  Will order Covid test as well as chest x-ray, provide patient with ibuprofen for headache.  Chest x-ray was performed did not show any acute abnormalities.  I have low suspicion for systemic infection as patient is nontoxic-appearing, vital signs reassuring, no signs of obvious infection seen on exam.  Low suspicion for CVA or intracranial head, brain mass as neuro exam was benign no focal deficits noted.  Unlikely patient suffering from otitis media or otitis externa as ear canal and TMs were visualized no signs of infection noted.  Likely patient's headache, ear pain, general malaise is result of a viral infection.  Low suspicion for strep throat as patient denies throat pain, no erythema exudates noted on exam.  I have low  suspicion for adverse outcome if Covid positive as she has no comorbidities, there is no new O2 requirement at this time.  Due to well-appearing patient benign physical exam further lab work and imaging were not warranted.  Patient appears to be resting calmly bed show no acute sign stress.  Vital signs have remained stable does not meet criteria to be admitted to the hospital.  Likely patient suffering from a viral infection, recommend self quarantine until Covid results are back.  If positive she must quarantine for 14 days.  Provide her post Covid care.  Patient discussed attending agrees assessment plan.  Patient was given at home schedule strict return precautions.  Patient verbalized that she understood and agreed with said plan. Final Clinical Impression(s) / ED Diagnoses Final diagnoses:  Viral upper respiratory tract infection    Rx / DC Orders ED Discharge Orders    None       Carroll Sage, PA-C 03/30/20 0940    Terrilee Files, MD 03/30/20 [phone]

## 2020-03-30 NOTE — Discharge Instructions (Signed)
You have been seen here for headache, fever, chills, general body aches.  Lab work and imaging all look reassuring.  You may take over-the-counter pain medications like ibuprofen and Tylenol every 6 hours as needed for pain.  Please follow dosing on back of bottle.  You have been tested for COVID-19 I want you to self quarantine until your results are back.  If your Covid is positive you must remain quarantine for 14 days and follow-up with primary care doctor further evaluation management.  I given you the contact information for post Covid care please call if Covid positive.  I want to come back to emergency department if you develop uncontrolled nausea, vomiting, diarrhea, severe chest pain, shortness of breath, as these symptoms require further evaluation management.

## 2021-05-14 NOTE — Progress Notes (Deleted)
New Patient Note  RE: Veronica Bradley MRN: [id number] DOB: 04-13-1991 Date of Office Visit: 05/15/2021  Consult requested by: Bonnetta Barry, PA-C Primary care provider: Patient, No Pcp Per (Inactive)  Chief Complaint: No chief complaint on file.  History of Present Illness: I had the pleasure of seeing Veronica Bradley for initial evaluation at the [facility] on 05/14/2021. She is a 30 y.o. female, who is referred here by Patient, No Pcp Per (Inactive) for the evaluation of food allergy.  She reports food allergy to ***. The reaction occurred at the age of ***, after she ate *** amount of ***. Symptoms started within *** and was in the form of *** hives, swelling, wheezing, abdominal pain, diarrhea, vomiting. ***Denies any associated cofactors such as exertion, infection, NSAID use, or alcohol consumption. The symptoms lasted for ***. She was evaluated in ED and received ***. Since this episode, she does *** not report other accidental exposures to ***. She does *** not have access to epinephrine autoinjector and *** needed to use it.   Past work up includes: ***. Dietary History: patient has been eating other foods including ***milk, ***eggs, ***peanut, ***treenuts, ***sesame, ***shellfish, ***fish, ***soy, ***wheat, ***meats, ***fruits and ***vegetables.  She reports reading labels and avoiding *** in diet completely. She tolerates ***baked egg and baked milk products.    Assessment and Plan: Veronica Bradley is a 30 y.o. female with: No problem-specific Assessment & Plan notes found for this encounter.  No follow-ups on file.  No orders of the defined types were placed in this encounter.  Lab Orders  No laboratory test(s) ordered today    Other allergy screening: Asthma: {Blank single:19197::"yes","no"} Rhino conjunctivitis: {Blank single:19197::"yes","no"} Food allergy: {Blank single:19197::"yes","no"} Medication allergy: {Blank  single:19197::"yes","no"} Hymenoptera allergy: {Blank single:19197::"yes","no"} Urticaria: {Blank single:19197::"yes","no"} Eczema:{Blank single:19197::"yes","no"} History of recurrent infections suggestive of immunodeficency: {Blank single:19197::"yes","no"}  Diagnostics: Spirometry:  Tracings reviewed. Her effort: {Blank single:19197::"Good reproducible efforts.","It was hard to get consistent efforts and there is a question as to whether this reflects a maximal maneuver.","Poor effort, data can not be interpreted."} FVC: ***L FEV1: ***L, ***% predicted FEV1/FVC ratio: ***% Interpretation: {Blank single:19197::"Spirometry consistent with mild obstructive disease","Spirometry consistent with moderate obstructive disease","Spirometry consistent with severe obstructive disease","Spirometry consistent with possible restrictive disease","Spirometry consistent with mixed obstructive and restrictive disease","Spirometry uninterpretable due to technique","Spirometry consistent with normal pattern","No overt abnormalities noted given today's efforts"}.  Please see scanned spirometry results for details.  Skin Testing: {Blank single:19197::"Select foods","Environmental allergy panel","Environmental allergy panel and select foods","Food allergy panel","None","Deferred due to recent antihistamines use"}. *** Results discussed with patient/family.   Past Medical History: There are no problems to display for this patient.  No past medical history on file. Past Surgical History: Past Surgical History:  Procedure Laterality Date  . DILATION AND CURETTAGE OF UTERUS     X3  . WISDOM TOOTH EXTRACTION     Medication List:  Current Outpatient Medications  Medication Sig Dispense Refill  . diclofenac (VOLTAREN) 50 MG EC tablet Take 1 tablet (50 mg total) 2 (two) times daily by mouth. (Patient not taking: Reported on 07/15/2019) 15 tablet 0  . diphenhydrAMINE (BENADRYL) 25 MG tablet Take 1 tablet (25  mg total) by mouth every 6 (six) hours as needed. (Patient not taking: Reported on 07/15/2019) 30 tablet 0  . Multiple Vitamins-Minerals (HAIR SKIN NAILS) CAPS Take 1 capsule by mouth daily.    . permethrin (ELIMITE) 5 % cream Thoroughly massage cream from head to soles of feet; leave on  for 8 to 14 hours before removing (shower or bath), do not apply to face. (Patient not taking: Reported on 07/15/2019) 30 g 0   No current facility-administered medications for this visit.   Allergies: Allergies  Allergen Reactions  . Shellfish Allergy Itching   Social History: Social History   Socioeconomic History  . Marital status: Single    Spouse name: Not on file  . Number of children: Not on file  . Years of education: Not on file  . Highest education level: Not on file  Occupational History  . Not on file  Tobacco Use  . Smoking status: Never  . Smokeless tobacco: Current  Substance and Sexual Activity  . Alcohol use: Yes    Alcohol/week: 1.0 standard drink    Types: 1 Glasses of wine per week  . Drug use: No  . Sexual activity: Not on file  Other Topics Concern  . Not on file  Social History Narrative  . Not on file   Social Determinants of Health   Financial Resource Strain: Not on file  Food Insecurity: Not on file  Transportation Needs: Not on file  Physical Activity: Not on file  Stress: Not on file  Social Connections: Not on file   Lives in a ***. Smoking: *** Occupation: ***  Environmental HistorySurveyor, minerals in the house: Copywriter, advertising in the family room: {Blank single:19197::"yes","no"} Carpet in the bedroom: {Blank single:19197::"yes","no"} Heating: {Blank single:19197::"electric","gas","heat pump"} Cooling: {Blank single:19197::"central","window","heat pump"} Pet: {Blank single:19197::"yes ***","no"}  Family History: No family history on file. Problem                               Relation Asthma                                    *** Eczema                                *** Food allergy                          *** Allergic rhino conjunctivitis     ***  Review of Systems  Constitutional:  Negative for appetite change, chills, fever and unexpected weight change.  HENT:  Negative for congestion and rhinorrhea.   Eyes:  Negative for itching.  Respiratory:  Negative for cough, chest tightness, shortness of breath and wheezing.   Cardiovascular:  Negative for chest pain.  Gastrointestinal:  Negative for abdominal pain.  Genitourinary:  Negative for difficulty urinating.  Skin:  Negative for rash.  Neurological:  Negative for headaches.   Objective: There were no vitals taken for this visit. There is no height or weight on file to calculate BMI. Physical Exam Vitals and nursing note reviewed.  Constitutional:      Appearance: Normal appearance. She is well-developed.  HENT:     Head: Normocephalic and atraumatic.     Right Ear: Tympanic membrane and external ear normal.     Left Ear: Tympanic membrane and external ear normal.     Nose: Nose normal.     Mouth/Throat:     Mouth: Mucous membranes are moist.     Pharynx: Oropharynx is clear.  Eyes:     Conjunctiva/sclera: Conjunctivae normal.  Cardiovascular:  Rate and Rhythm: Normal rate and regular rhythm.     Heart sounds: Normal heart sounds. No murmur heard.   No friction rub. No gallop.  Pulmonary:     Effort: Pulmonary effort is normal.     Breath sounds: Normal breath sounds. No wheezing, rhonchi or rales.  Musculoskeletal:     Cervical back: Neck supple.  Skin:    General: Skin is warm.     Findings: No rash.  Neurological:     Mental Status: She is alert and oriented to person, place, and time.  Psychiatric:        Behavior: Behavior normal.  The plan was reviewed with the patient/family, and all questions/concerned were addressed.  It was my pleasure to see Veronica Bradley today and participate in her care. Please feel free to contact  me with any questions or concerns.  Sincerely,  Wyline Mood, DO Allergy & Immunology  [facility] office: [phone] [facility] office: [phone]

## 2021-05-15 ENCOUNTER — Ambulatory Visit: Payer: Medicaid Other | Admitting: Allergy

## 2021-07-25 NOTE — Progress Notes (Signed)
New Patient Note  RE: Veronica Bradley MRN: [id number] DOB: [birth year] Date of Office Visit: 07/26/2021  Consult requested by: Bonnetta Barry, PA-C Primary care provider: Bonnetta Barry, PA-C  Chief Complaint: Allergic Reaction (When she touches shellfish( shrimp) she starts to itch, possibly chocolate and fabric softener.  Also not sure all the foods that cause her to itch )  History of Present Illness: I had the pleasure of seeing Veronica Bradley for initial evaluation at the [facility] on 07/26/2021. She is a 30 y.o. female, who is referred here by Mauri Brooklyn, PA for the evaluation of itching and food allergy.  Food:  She reports food allergy to shellfish. The reaction started to occur last year, after she touched raw shrimp. Symptoms started within minutes and was in the form of itchy skin. Denies any hives, swelling, wheezing, abdominal pain, diarrhea, vomiting. Denies any associated cofactors such as exertion, infection, NSAID use, or alcohol consumption. The symptoms lasted for 30 minutes after taking antihistamines.  Patient ate shrimp 2 weeks ago and noticed itching.  Chocolate causes itching depending on how much she is eating.  Peanuts cause itching as well.  She was not evaluated in ED. She does not have access to epinephrine autoinjector  Past work up includes: none. Dietary History: patient has been eating other foods including milk, eggs, peanut, treenuts, sesame, shellfish - okay with crab, lobster, fish, soy, wheat, meats - chicken, fruits and vegetables.  Yesterday noticed some itching after using her blanket. She washed her laundry in [place] which she has been using beforehand with no issues. Denies any other changes in diet, meds or personal care products.   Assessment and Plan: Zuly is a 30 y.o. female with: Other adverse food reactions, not elsewhere classified, subsequent encounter Noticed itching with shrimp, peanuts and increased  chocolate consumption. Symptoms resolve with antihistamines. Today's skin testing showed: Negative to select foods including shrimp peanut and chocolate.  Food allergen skin testing has excellent negative predictive value however there is still a small chance that the allergy exists. Therefore, we will investigate further with serum specific IgE levels and, if negative then schedule for open graded oral food challenge. Get bloodwork.  Start strict avoidance of shellfish, peanuts and chocolate.  I have prescribed epinephrine injectable device and demonstrated proper use. For mild symptoms you can take over the counter antihistamines such as Benadryl and monitor symptoms closely. If symptoms worsen or if you have severe symptoms including breathing issues, throat closure, significant swelling, whole body hives, severe diarrhea and vomiting, lightheadedness then inject epinephrine and seek immediate medical care afterwards. Emergency action plan given.  Other allergic rhinitis Noticed some rhinitis symptoms for the past year. Takes zyrtec prn with good benefit. Today's skin prick testing showed: Positive to grass and tree pollen. Will get bloodwork instead of intradermal testing as ordering bloodwork as above already.  Start environmental control measures as below. Use over the counter antihistamines such as Zyrtec (cetirizine), Claritin (loratadine), Allegra (fexofenadine), or Xyzal (levocetirizine) daily as needed. May take twice a day during allergy flares. May switch antihistamines every few months.  Other atopic dermatitis History of eczema and noticed increased itching lately. See below for proper skin care. Try fragrance free and dye free products.  If no improvement, consider true patch testing next.   Return in about 6 months (around 01/24/2022).  Meds ordered this encounter  Medications   EPINEPHrine 0.3 mg/0.3 mL IJ SOAJ injection    Sig: Inject  0.3 mg into the muscle as needed for  anaphylaxis.    Dispense:  1 each    Refill:  1    May dispense generic/Mylan/Teva brand.    Lab Orders         Allergen Profile, Shellfish         Allergens w/Total IgE Area 2         Chocolate IgE         IgE Peanut w/Component Reflex      Other allergy screening: Asthma: no Rhino conjunctivitis: yes Sneezing and itchy skin for the past 1 year. Taking zyrtec prn with good benefit. Skin testing at age 87 in [place] was negative per patient report. No prior AIT.  Medication allergy: no Hymenoptera allergy: no Urticaria: no Eczema:yes Using gold bond eczema cream. History of recurrent infections suggestive of immunodeficency: no  Diagnostics: Skin Testing: Environmental allergy panel and select foods. Negative to select foods.  Positive to grass and tree pollen. Results discussed with patient/family.  Airborne Adult Perc - 07/26/21 0942     Time Antigen Placed 2992    Allergen Manufacturer Waynette Buttery    Location Back    Number of Test 59    1. Control-Buffer 50% Glycerol Negative    2. Control-Histamine 1 mg/ml 2+    3. Albumin saline Negative    5. French Southern Territories Negative    6. Johnson Negative    7. Kentucky Blue Negative    8. Meadow Fescue Negative    9. Perennial Rye --   +/-   10. Sweet Vernal Negative    11. Timothy 2+    12. Cocklebur Negative    13. Burweed Marshelder Negative    14. Ragweed, short Negative    15. Ragweed, Giant Negative    16. Plantain,  English Negative    17. Lamb's Quarters Negative    18. Sheep Sorrell Negative    19. Rough Pigweed Negative    20. Marsh Elder, Rough Negative    21. Mugwort, Common Negative    22. Ash mix Negative    23. Birch mix Negative    24. Beech American Negative    25. Box, Elder Negative    26. Cedar, red Negative    27. Cottonwood, Guinea-Bissau Negative    28. Elm mix Negative    29. Hickory Negative    30. Maple mix Negative    31. Oak, Guinea-Bissau mix 2+    32. Pecan Pollen 2+    33. Pine mix Negative    34.  Sycamore Eastern Negative    35. Walnut, Black Pollen Negative    36. Alternaria alternata Negative    37. Cladosporium Herbarum Negative    38. Aspergillus mix Negative    39. Penicillium mix Negative    40. Bipolaris sorokiniana (Helminthosporium) Negative    41. Drechslera spicifera (Curvularia) Negative    42. Mucor plumbeus Negative    43. Fusarium moniliforme Negative    44. Aureobasidium pullulans (pullulara) Negative    45. Rhizopus oryzae Negative    46. Botrytis cinera Negative    47. Epicoccum nigrum Negative    48. Phoma betae Negative    49. Candida Albicans Negative    50. Trichophyton mentagrophytes Negative    51. Mite, D Farinae  5,000 AU/ml Negative    52. Mite, D Pteronyssinus  5,000 AU/ml Negative    53. Cat Hair 10,000 BAU/ml Negative    54.  Dog Epithelia Negative    55. Mixed Feathers  Negative    56. Horse Epithelia Negative    57. Cockroach, German Negative    58. Mouse Negative    59. Tobacco Leaf Negative             Food Adult Perc - 07/26/21 0900     Time Antigen Placed 1610    Allergen Manufacturer Waynette Buttery    Location Back    Number of allergen test 16    Control-Histamine 1 mg/ml 2+    1. Peanut Negative    2. Soybean Negative    3. Wheat Negative    4. Sesame Negative    5. Milk, cow Negative    6. Egg White, Chicken Negative    7. Casein Negative    8. Shellfish Mix Negative    9. Fish Mix Negative    25. Shrimp Negative    26. Crab Negative    27. Lobster Negative    28. Oyster Negative    29. Scallops Negative    64. Chocolate/Cacao bean Negative             Past Medical History: Patient Active Problem List   Diagnosis Date Noted   Other adverse food reactions, not elsewhere classified, subsequent encounter 07/26/2021   Other allergic rhinitis 07/26/2021   Other atopic dermatitis 07/26/2021    Past Medical History:  Diagnosis Date   Eczema    Past Surgical History: Past Surgical History:  Procedure Laterality  Date   DILATION AND CURETTAGE OF UTERUS     X3   WISDOM TOOTH EXTRACTION     Medication List:  Current Outpatient Medications  Medication Sig Dispense Refill   EPINEPHrine 0.3 mg/0.3 mL IJ SOAJ injection Inject 0.3 mg into the muscle as needed for anaphylaxis. 1 each 1   Multiple Vitamins-Minerals (HAIR SKIN NAILS) CAPS Take 1 capsule by mouth daily.     No current facility-administered medications for this visit.   Allergies: Allergies  Allergen Reactions   Shellfish Allergy Itching   Social History: Social History   Socioeconomic History   Marital status: Single    Spouse name: Not on file   Number of children: Not on file   Years of education: Not on file   Highest education level: Not on file  Occupational History   Not on file  Tobacco Use   Smoking status: Never   Smokeless tobacco: Current  Substance and Sexual Activity   Alcohol use: Yes    Alcohol/week: 1.0 standard drink    Types: 1 Glasses of wine per week   Drug use: No   Sexual activity: Not on file  Other Topics Concern   Not on file  Social History Narrative   Not on file   Social Determinants of Health   Financial Resource Strain: Not on file  Food Insecurity: Not on file  Transportation Needs: Not on file  Physical Activity: Not on file  Stress: Not on file  Social Connections: Not on file   Lives in a townhome. Smoking: denies Occupation: Medical sales representative HistorySurveyor, minerals in the house: no Engineer, civil (consulting) in the family room: yes Carpet in the bedroom: yes Heating: electric Cooling: central Pet: yes 1 dog x 3 months  Family History: Family History  Problem Relation Age of Onset   Allergic rhinitis Mother    Eczema Father    Allergic rhinitis Maternal Grandmother    Review of Systems  Constitutional:  Negative for appetite change, chills, fever and unexpected weight change.  HENT:  Positive for sneezing. Negative for congestion and rhinorrhea.        Itchy ears   Eyes:  Negative for itching.  Respiratory:  Negative for cough, chest tightness, shortness of breath and wheezing.   Cardiovascular:  Negative for chest pain.  Gastrointestinal:  Negative for abdominal pain.  Genitourinary:  Negative for difficulty urinating.  Skin:  Negative for rash.       itching  Allergic/Immunologic: Positive for environmental allergies.   Objective: BP 124/80   Pulse 78   Temp 97.7 F (36.5 C)   Resp 18   Ht 5' (1.524 m)   Wt 174 lb 12.8 oz (79.3 kg)   SpO2 98%   BMI 34.14 kg/m  Body mass index is 34.14 kg/m. Physical Exam Vitals and nursing note reviewed.  Constitutional:      Appearance: Normal appearance. She is well-developed.  HENT:     Head: Normocephalic and atraumatic.     Right Ear: Tympanic membrane normal. There is impacted cerumen.     Left Ear: Tympanic membrane and external ear normal.     Nose: Nose normal.     Mouth/Throat:     Mouth: Mucous membranes are moist.     Pharynx: Oropharynx is clear.  Eyes:     Conjunctiva/sclera: Conjunctivae normal.  Cardiovascular:     Rate and Rhythm: Normal rate and regular rhythm.     Heart sounds: Normal heart sounds. No murmur heard.   No friction rub. No gallop.  Pulmonary:     Effort: Pulmonary effort is normal.     Breath sounds: Normal breath sounds. No wheezing, rhonchi or rales.  Musculoskeletal:     Cervical back: Neck supple.  Skin:    General: Skin is warm.     Findings: No rash.  Neurological:     Mental Status: She is alert and oriented to person, place, and time.  Psychiatric:        Behavior: Behavior normal.  The plan was reviewed with the patient/family, and all questions/concerned were addressed.  It was my pleasure to see Hareem today and participate in her care. Please feel free to contact me with any questions or concerns.  Sincerely,  Wyline Mood, DO Allergy & Immunology  [facility] office: [phone] [facility]  office: [phone]

## 2021-07-26 ENCOUNTER — Ambulatory Visit: Payer: Medicaid Other | Admitting: Allergy

## 2021-07-26 ENCOUNTER — Other Ambulatory Visit: Payer: Self-pay

## 2021-07-26 ENCOUNTER — Encounter: Payer: Self-pay | Admitting: Allergy

## 2021-07-26 ENCOUNTER — Ambulatory Visit (INDEPENDENT_AMBULATORY_CARE_PROVIDER_SITE_OTHER): Payer: Medicaid Other | Admitting: Allergy

## 2021-07-26 VITALS — BP 124/80 | HR 78 | Temp 97.7°F | Resp 18 | Ht 60.0 in | Wt 174.8 lb

## 2021-07-26 DIAGNOSIS — J3089 Other allergic rhinitis: Secondary | ICD-10-CM

## 2021-07-26 DIAGNOSIS — L2089 Other atopic dermatitis: Secondary | ICD-10-CM

## 2021-07-26 DIAGNOSIS — T781XXA Other adverse food reactions, not elsewhere classified, initial encounter: Secondary | ICD-10-CM | POA: Diagnosis not present

## 2021-07-26 DIAGNOSIS — T781XXD Other adverse food reactions, not elsewhere classified, subsequent encounter: Secondary | ICD-10-CM | POA: Insufficient documentation

## 2021-07-26 HISTORY — DX: Other allergic rhinitis: J30.89

## 2021-07-26 HISTORY — DX: Other atopic dermatitis: L20.89

## 2021-07-26 MED ORDER — EPINEPHRINE 0.3 MG/0.3ML IJ SOAJ: 0.3000 mg | INTRAMUSCULAR | 1 refills | Status: AC | PRN

## 2021-07-26 NOTE — Assessment & Plan Note (Addendum)
Noticed itching with shrimp, peanuts and increased chocolate consumption. Symptoms resolve with antihistamines.  Today's skin testing showed: Negative to select foods including shrimp peanut and chocolate.  Food allergen skin testing has excellent negative predictive value however there is still a small chance that the allergy exists. Therefore, we will investigate further with serum specific IgE levels and, if negative then schedule for open graded oral food challenge. Get bloodwork.   Start strict avoidance of shellfish, peanuts and chocolate.   I have prescribed epinephrine injectable device and demonstrated proper use. For mild symptoms you can take over the counter antihistamines such as Benadryl and monitor symptoms closely. If symptoms worsen or if you have severe symptoms including breathing issues, throat closure, significant swelling, whole body hives, severe diarrhea and vomiting, lightheadedness then inject epinephrine and seek immediate medical care afterwards.  Emergency action plan given.

## 2021-07-26 NOTE — Patient Instructions (Addendum)
Today's skin testing showed: Negative to select foods.  Positive to grass and tree pollen. Results given.  Food allergies Start strict avoidance of shellfish, peanuts and chocolate.  I have prescribed epinephrine injectable device and demonstrated proper use. For mild symptoms you can take over the counter antihistamines such as Benadryl and monitor symptoms closely. If symptoms worsen or if you have severe symptoms including breathing issues, throat closure, significant swelling, whole body hives, severe diarrhea and vomiting, lightheadedness then inject epinephrine and seek immediate medical care afterwards. Emergency action plan given.  Get bloodwork We are ordering labs, so please allow 1-2 weeks for the results to come back. With the newly implemented Cures Act, the labs might be visible to you at the same time that they become visible to me. However, I will not address the results until all of the results are back, so please be patient.  In the meantime, continue recommendations in your patient instructions, including avoidance measures (if applicable), until you hear from me.  Environmental allergies Start environmental control measures as below. Use over the counter antihistamines such as Zyrtec (cetirizine), Claritin (loratadine), Allegra (fexofenadine), or Xyzal (levocetirizine) daily as needed. May take twice a day during allergy flares. May switch antihistamines every few months.  Skin See below for proper skin care. Try fragrance free and dye free products.   Follow up in 6 months or sooner if needed.    Reducing Pollen Exposure Pollen seasons: trees (spring), grass (summer) and ragweed/weeds (fall). Keep windows closed in your home and car to lower pollen exposure.  Install air conditioning in the bedroom and throughout the house if possible.  Avoid going out in dry windy days - especially early morning. Pollen counts are highest between 5 - 10 AM and on dry, hot and windy  days.  Save outside activities for late afternoon or after a heavy rain, when pollen levels are lower.  Avoid mowing of grass if you have grass pollen allergy. Be aware that pollen can also be transported indoors on people and pets.  Dry your clothes in an automatic dryer rather than hanging them outside where they might collect pollen.  Rinse hair and eyes before bedtime.  Skin care recommendations  Bath time: Always use lukewarm water. AVOID very hot or cold water. Keep bathing time to 5-10 minutes. Do NOT use bubble bath. Use a mild soap and use just enough to wash the dirty areas. Do NOT scrub skin vigorously.  After bathing, pat dry your skin with a towel. Do NOT rub or scrub the skin.  Moisturizers and prescriptions:  ALWAYS apply moisturizers immediately after bathing (within 3 minutes). This helps to lock-in moisture. Use the moisturizer several times a day over the whole body. Good summer moisturizers include: Aveeno, CeraVe, Cetaphil. Good winter moisturizers include: Aquaphor, Vaseline, Cerave, Cetaphil, Eucerin, Vanicream. When using moisturizers along with medications, the moisturizer should be applied about one hour after applying the medication to prevent diluting effect of the medication or moisturize around where you applied the medications. When not using medications, the moisturizer can be continued twice daily as maintenance.  Laundry and clothing: Avoid laundry products with added color or perfumes. Use unscented hypo-allergenic laundry products such as Tide free, Cheer free & gentle, and All free and clear.  If the skin still seems dry or sensitive, you can try double-rinsing the clothes. Avoid tight or scratchy clothing such as wool. Do not use fabric softeners or dyer sheets.

## 2021-07-26 NOTE — Assessment & Plan Note (Signed)
History of eczema and noticed increased itching lately. . See below for proper skin care. . Try fragrance free and dye free products.  . If no improvement, consider true patch testing next.

## 2021-07-26 NOTE — Assessment & Plan Note (Signed)
Noticed some rhinitis symptoms for the past year. Takes zyrtec prn with good benefit.  Today's skin prick testing showed: Positive to grass and tree pollen.  Will get bloodwork instead of intradermal testing as ordering bloodwork as above already.   Start environmental control measures as below.  Use over the counter antihistamines such as Zyrtec (cetirizine), Claritin (loratadine), Allegra (fexofenadine), or Xyzal (levocetirizine) daily as needed. May take twice a day during allergy flares. May switch antihistamines every few months.

## 2021-07-30 LAB — ALLERGENS W/TOTAL IGE AREA 2
Alternaria Alternata IgE: <0.1 kU/L
Aspergillus Fumigatus IgE: <0.1 kU/L
Bermuda Grass IgE: <0.1 kU/L
Cat Dander IgE: <0.1 kU/L
Cedar, Mountain IgE: 0.11 kU/L — AB
Cladosporium Herbarum IgE: <0.1 kU/L
Cockroach, German IgE: 2.36 kU/L — AB
Common Silver Birch IgE: 0.6 kU/L — AB
Cottonwood IgE: <0.1 kU/L
D Farinae IgE: 0.6 kU/L — AB
D Pteronyssinus IgE: 0.56 kU/L — AB
Dog Dander IgE: 2.21 kU/L — AB
Elm, American IgE: <0.1 kU/L
IgE (Immunoglobulin E), Serum: 219 IU/mL (ref 6–495)
Johnson Grass IgE: <0.1 kU/L
Maple/Box Elder IgE: <0.1 kU/L
Mouse Urine IgE: <0.1 kU/L
Oak, White IgE: 0.73 kU/L — AB
Pecan, Hickory IgE: 0.29 kU/L — AB
Penicillium Chrysogen IgE: <0.1 kU/L
Pigweed, Rough IgE: <0.1 kU/L
Ragweed, Short IgE: 0.26 kU/L — AB
Sheep Sorrel IgE Qn: <0.1 kU/L
Timothy Grass IgE: 0.7 kU/L — AB
White Mulberry IgE: <0.1 kU/L

## 2021-07-30 LAB — ALLERGEN PROFILE, SHELLFISH
Clam IgE: <0.1 kU/L
F023-IgE Crab: <0.1 kU/L
F080-IgE Lobster: <0.1 kU/L
F290-IgE Oyster: <0.1 kU/L
Scallop IgE: 0.12 kU/L — AB
Shrimp IgE: 16.7 kU/L — AB

## 2021-07-30 LAB — ALLERGEN CHOCOLATE: Chocolate/Cacao IgE: <0.1 kU/L

## 2021-07-30 LAB — IGE PEANUT W/COMPONENT REFLEX: Peanut, IgE: <0.1 kU/L

## 2021-09-22 IMAGING — CR DG CHEST 2V
2 series · 2 of 2 positions shown · non-contrast
Comparison: None.

CLINICAL DATA: Cough

EXAM:
CHEST - 2 VIEW

[w chest pa]
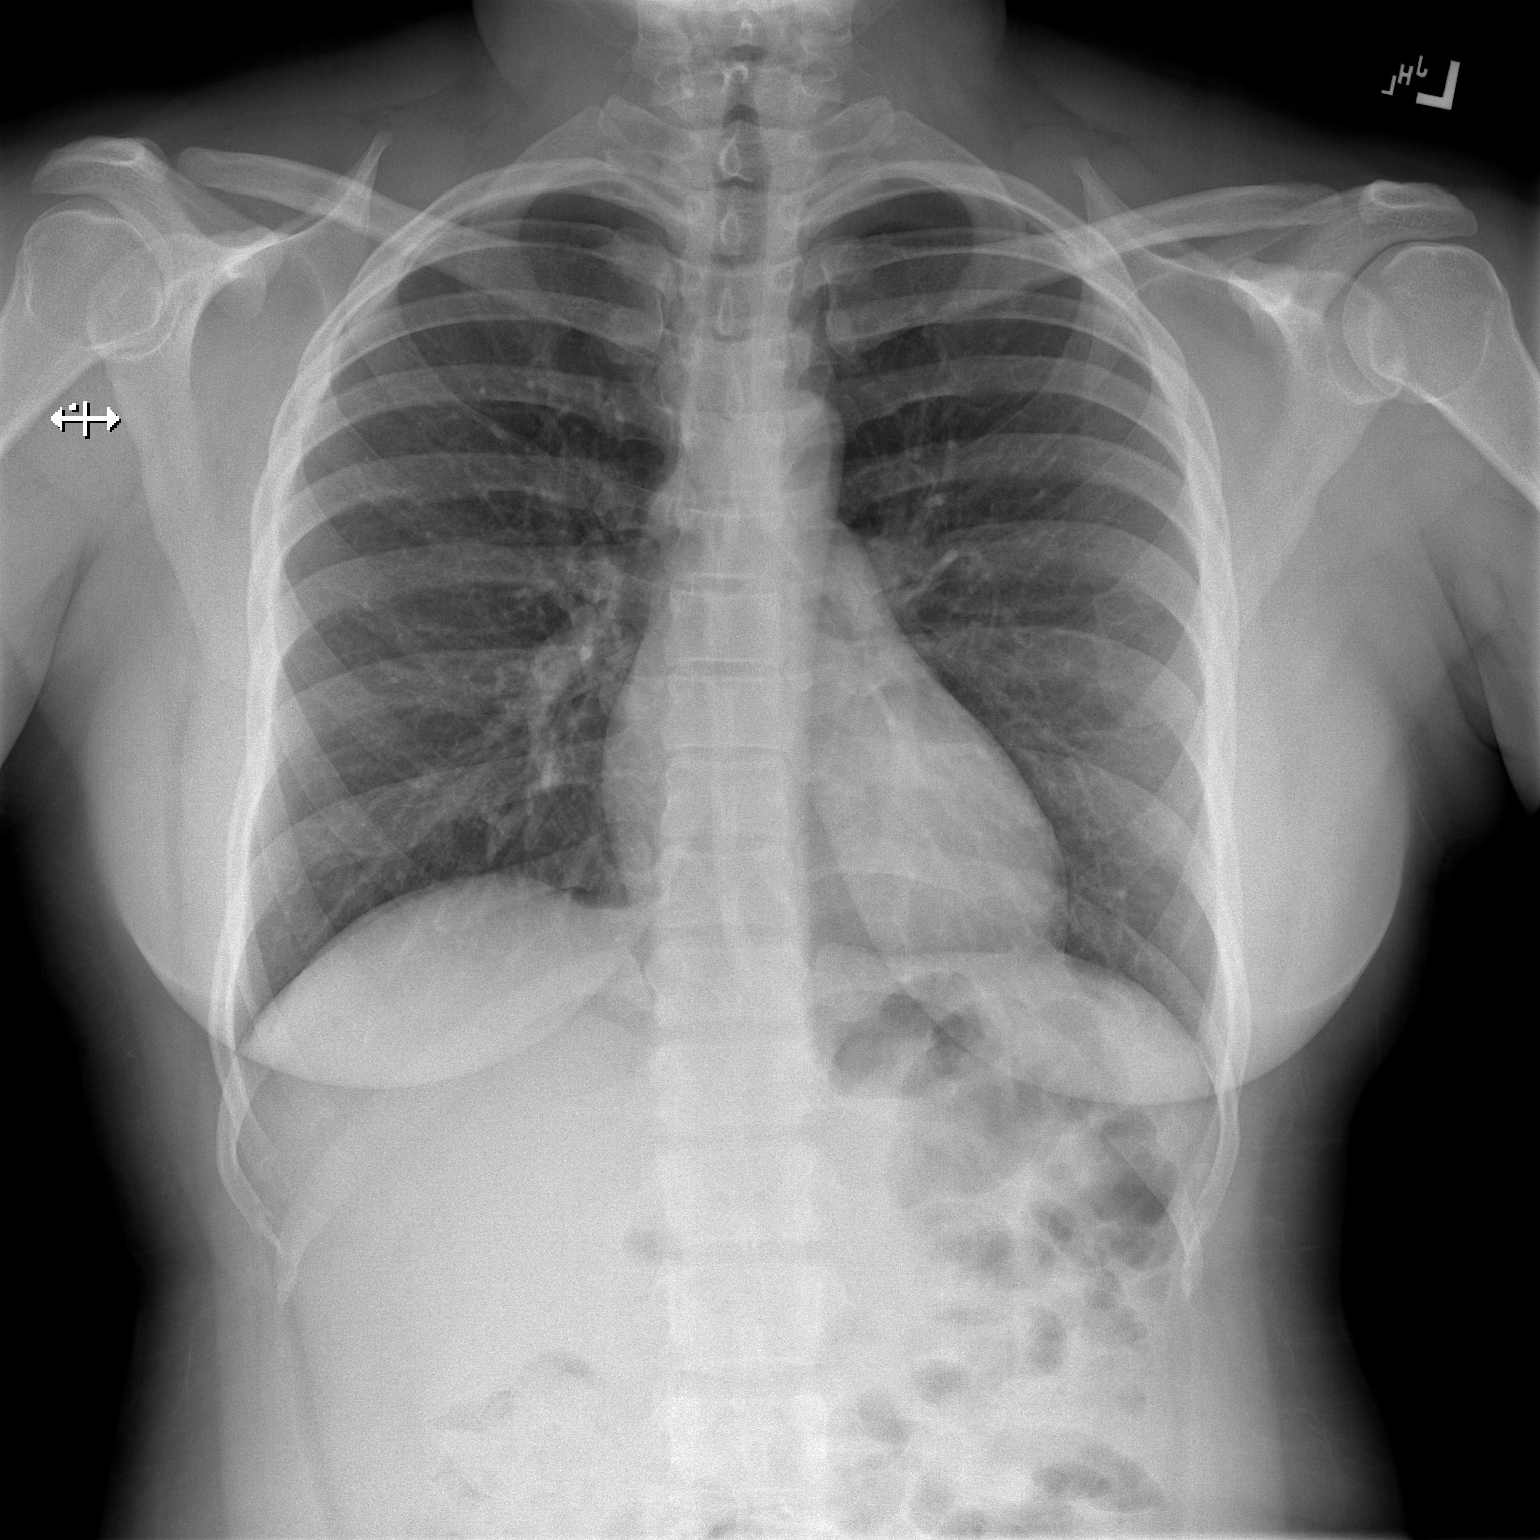

[w chest lat]
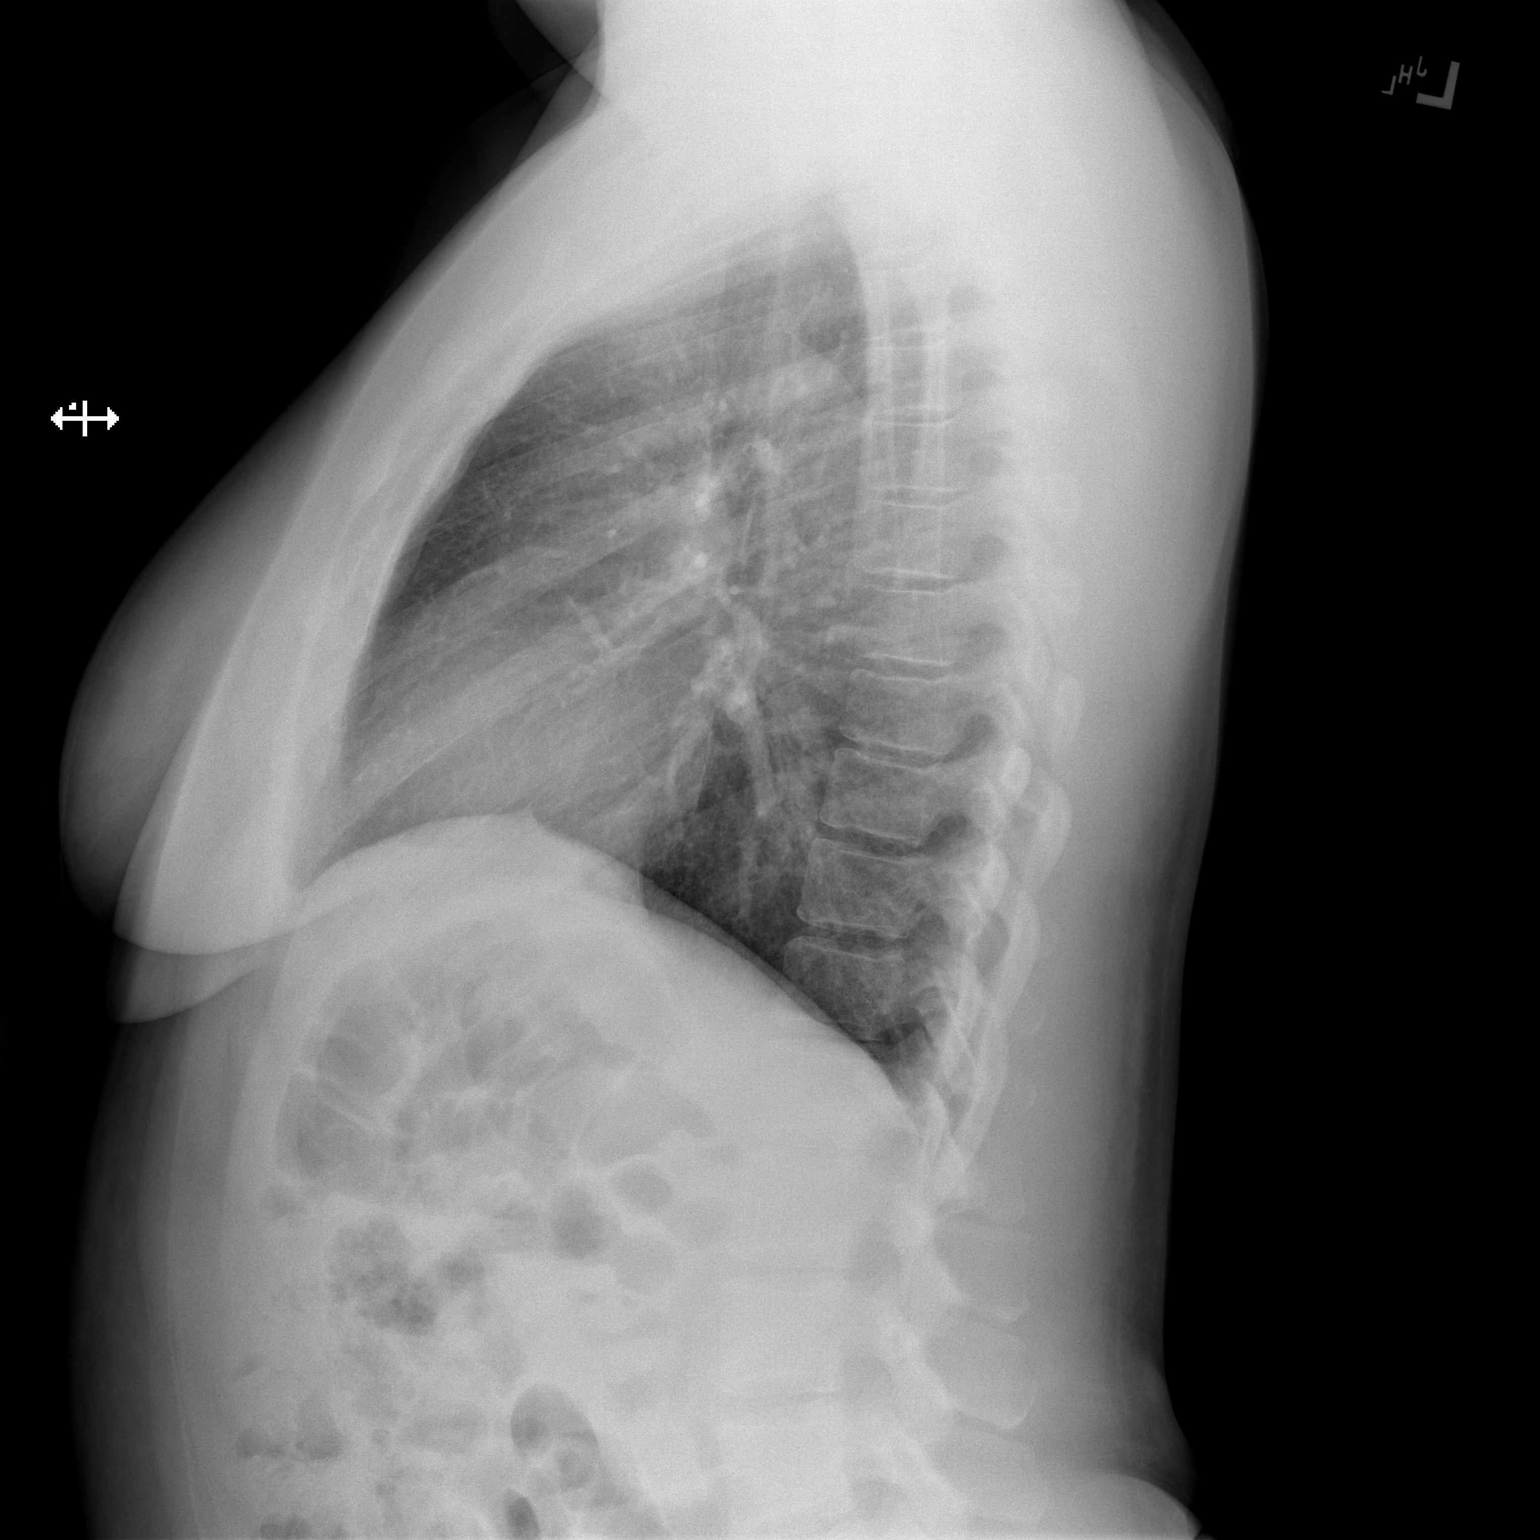

[2 of 2 positions shown; findings below may reference images not displayed]

FINDINGS: The heart size and mediastinal contours are within normal limits.
Both lungs are clear. The visualized skeletal structures are
unremarkable.
IMPRESSION: No active cardiopulmonary disease.

## 2022-01-28 NOTE — Progress Notes (Deleted)
Follow Up Note  RE: Veronica Bradley MRN: [id number] DOB: 03/30/1991 Date of Office Visit: 01/29/2022  Referring provider: Bonnetta Barry, PA-C Primary care provider: Bonnetta Barry, PA-C  Chief Complaint: No chief complaint on file.  History of Present Illness: I had the pleasure of seeing Veronica Bradley for a follow up visit at the [facility] on 01/28/2022. She is a 31 y.o. female, who is being followed for adverse food reaction, allergic rhinitis and atopic dermatitis. Her previous allergy office visit was on 07/26/2021 with Dr. Selena Batten. Today is a regular follow up visit.  Bloodwork positive to shrimp.   Environmental allergy panel positive to dust mites, dog, grass, cockroach, trees, and borderline to ragweed pollen.   Negative to chocolate, peanut.    Limit chocolate and peanut in the diet and monitor symptoms after eating them.    Continue strict avoidance of shrimp - be careful about cross contamination when eating other seafood.    Send handout o dust mites, pet dander and pollen. Thank you.   Other adverse food reactions, not elsewhere classified, subsequent encounter Noticed itching with shrimp, peanuts and increased chocolate consumption. Symptoms resolve with antihistamines. Today's skin testing showed: Negative to select foods including shrimp peanut and chocolate.  Food allergen skin testing has excellent negative predictive value however there is still a small chance that the allergy exists. Therefore, we will investigate further with serum specific IgE levels and, if negative then schedule for open graded oral food challenge. Get bloodwork.  Start strict avoidance of shellfish, peanuts and chocolate.  I have prescribed epinephrine injectable device and demonstrated proper use. For mild symptoms you can take over the counter antihistamines such as Benadryl and monitor symptoms closely. If symptoms worsen or if you have severe symptoms including  breathing issues, throat closure, significant swelling, whole body hives, severe diarrhea and vomiting, lightheadedness then inject epinephrine and seek immediate medical care afterwards. Emergency action plan given.   Other allergic rhinitis Noticed some rhinitis symptoms for the past year. Takes zyrtec prn with good benefit. Today's skin prick testing showed: Positive to grass and tree pollen. Will get bloodwork instead of intradermal testing as ordering bloodwork as above already.  Start environmental control measures as below. Use over the counter antihistamines such as Zyrtec (cetirizine), Claritin (loratadine), Allegra (fexofenadine), or Xyzal (levocetirizine) daily as needed. May take twice a day during allergy flares. May switch antihistamines every few months.   Other atopic dermatitis History of eczema and noticed increased itching lately. See below for proper skin care. Try fragrance free and dye free products.  If no improvement, consider true patch testing next.    Return in about 6 months (around 01/24/2022).  Assessment and Plan: Veronica Bradley is a 31 y.o. female with: No problem-specific Assessment & Plan notes found for this encounter.  No follow-ups on file.  No orders of the defined types were placed in this encounter.  Lab Orders  No laboratory test(s) ordered today    Diagnostics: Spirometry:  Tracings reviewed. Her effort: {Blank single:19197::"Good reproducible efforts.","It was hard to get consistent efforts and there is a question as to whether this reflects a maximal maneuver.","Poor effort, data can not be interpreted."} FVC: ***L FEV1: ***L, ***% predicted FEV1/FVC ratio: ***% Interpretation: {Blank single:19197::"Spirometry consistent with mild obstructive disease","Spirometry consistent with moderate obstructive disease","Spirometry consistent with severe obstructive disease","Spirometry consistent with possible restrictive disease","Spirometry consistent with  mixed obstructive and restrictive disease","Spirometry uninterpretable due to technique","Spirometry consistent with normal pattern","No overt  abnormalities noted given today's efforts"}.  Please see scanned spirometry results for details.  Skin Testing: {Blank single:19197::"Select foods","Environmental allergy panel","Environmental allergy panel and select foods","Food allergy panel","None","Deferred due to recent antihistamines use"}. *** Results discussed with patient/family.   Medication List:  Current Outpatient Medications  Medication Sig Dispense Refill   EPINEPHrine 0.3 mg/0.3 mL IJ SOAJ injection Inject 0.3 mg into the muscle as needed for anaphylaxis. 1 each 1   Multiple Vitamins-Minerals (HAIR SKIN NAILS) CAPS Take 1 capsule by mouth daily.     No current facility-administered medications for this visit.   Allergies: Allergies  Allergen Reactions   Shellfish Allergy Itching   I reviewed her past medical history, social history, family history, and environmental history and no significant changes have been reported from her previous visit.  Review of Systems  Constitutional:  Negative for appetite change, chills, fever and unexpected weight change.  HENT:  Positive for sneezing. Negative for congestion and rhinorrhea.        Itchy ears  Eyes:  Negative for itching.  Respiratory:  Negative for cough, chest tightness, shortness of breath and wheezing.   Cardiovascular:  Negative for chest pain.  Gastrointestinal:  Negative for abdominal pain.  Genitourinary:  Negative for difficulty urinating.  Skin:  Negative for rash.       itching  Allergic/Immunologic: Positive for environmental allergies and food allergies.    Objective: There were no vitals taken for this visit. There is no height or weight on file to calculate BMI. Physical Exam Vitals and nursing note reviewed.  Constitutional:      Appearance: Normal appearance. She is well-developed.  HENT:     Head:  Normocephalic and atraumatic.     Right Ear: Tympanic membrane normal. There is impacted cerumen.     Left Ear: Tympanic membrane and external ear normal.     Nose: Nose normal.     Mouth/Throat:     Mouth: Mucous membranes are moist.     Pharynx: Oropharynx is clear.  Eyes:     Conjunctiva/sclera: Conjunctivae normal.  Cardiovascular:     Rate and Rhythm: Normal rate and regular rhythm.     Heart sounds: Normal heart sounds. No murmur heard.    No friction rub. No gallop.  Pulmonary:     Effort: Pulmonary effort is normal.     Breath sounds: Normal breath sounds. No wheezing, rhonchi or rales.  Musculoskeletal:     Cervical back: Neck supple.  Skin:    General: Skin is warm.     Findings: No rash.  Neurological:     Mental Status: She is alert and oriented to person, place, and time.  Psychiatric:        Behavior: Behavior normal.    Previous notes and tests were reviewed. The plan was reviewed with the patient/family, and all questions/concerned were addressed.  It was my pleasure to see Veronica Bradley today and participate in her care. Please feel free to contact me with any questions or concerns.  Sincerely,  Wyline Mood, DO Allergy & Immunology  [facility] office: [phone] [facility] office: [phone]

## 2022-01-29 ENCOUNTER — Ambulatory Visit: Payer: Medicaid Other | Admitting: Allergy

## 2022-01-29 DIAGNOSIS — T781XXD Other adverse food reactions, not elsewhere classified, subsequent encounter: Secondary | ICD-10-CM

## 2022-01-29 DIAGNOSIS — L2089 Other atopic dermatitis: Secondary | ICD-10-CM

## 2022-01-29 DIAGNOSIS — J302 Other seasonal allergic rhinitis: Secondary | ICD-10-CM

## 2022-02-26 ENCOUNTER — Ambulatory Visit: Payer: Medicaid Other | Admitting: Allergy

## 2022-02-26 NOTE — Progress Notes (Deleted)
Follow Up Note  RE: Veronica Bradley MRN: [id number] DOB: 01-Nov-1990 Date of Office Visit: 02/26/2022  Referring provider: Bonnetta Barry, PA-C Primary care provider: Bonnetta Barry, PA-C  Chief Complaint: No chief complaint on file.  History of Present Illness: I had the pleasure of seeing Veronica Bradley for a follow up visit at the [facility] on 02/26/2022. She is a 31 y.o. female, who is being followed for adverse food reaction, allergic rhinitis and atopic dermatitis. Her previous allergy office visit was on 07/26/2021 with Dr. Selena Batten. Today is a regular follow up visit.  Other adverse food reactions, not elsewhere classified, subsequent encounter Noticed itching with shrimp, peanuts and increased chocolate consumption. Symptoms resolve with antihistamines. Today's skin testing showed: Negative to select foods including shrimp peanut and chocolate.  Food allergen skin testing has excellent negative predictive value however there is still a small chance that the allergy exists. Therefore, we will investigate further with serum specific IgE levels and, if negative then schedule for open graded oral food challenge. Get bloodwork.  Start strict avoidance of shellfish, peanuts and chocolate.  I have prescribed epinephrine injectable device and demonstrated proper use. For mild symptoms you can take over the counter antihistamines such as Benadryl and monitor symptoms closely. If symptoms worsen or if you have severe symptoms including breathing issues, throat closure, significant swelling, whole body hives, severe diarrhea and vomiting, lightheadedness then inject epinephrine and seek immediate medical care afterwards. Emergency action plan given.   Other allergic rhinitis Noticed some rhinitis symptoms for the past year. Takes zyrtec prn with good benefit. Today's skin prick testing showed: Positive to grass and tree pollen. Will get bloodwork instead of intradermal  testing as ordering bloodwork as above already.  Start environmental control measures as below. Use over the counter antihistamines such as Zyrtec (cetirizine), Claritin (loratadine), Allegra (fexofenadine), or Xyzal (levocetirizine) daily as needed. May take twice a day during allergy flares. May switch antihistamines every few months.   Other atopic dermatitis History of eczema and noticed increased itching lately. See below for proper skin care. Try fragrance free and dye free products.  If no improvement, consider true patch testing next.    Bloodwork positive to shrimp.   Environmental allergy panel positive to dust mites, dog, grass, cockroach, trees, and borderline to ragweed pollen.   Negative to chocolate, peanut.    Limit chocolate and peanut in the diet and monitor symptoms after eating them.    Continue strict avoidance of shrimp - be careful about cross contamination when eating other seafood.    Send handout o dust mites, pet dander and pollen. Thank you.   Assessment and Plan: Veronica Bradley is a 31 y.o. female with: No problem-specific Assessment & Plan notes found for this encounter.  No follow-ups on file.  No orders of the defined types were placed in this encounter.  Lab Orders  No laboratory test(s) ordered today    Diagnostics: Spirometry:  Tracings reviewed. Her effort: {Blank single:19197::"Good reproducible efforts.","It was hard to get consistent efforts and there is a question as to whether this reflects a maximal maneuver.","Poor effort, data can not be interpreted."} FVC: ***L FEV1: ***L, ***% predicted FEV1/FVC ratio: ***% Interpretation: {Blank single:19197::"Spirometry consistent with mild obstructive disease","Spirometry consistent with moderate obstructive disease","Spirometry consistent with severe obstructive disease","Spirometry consistent with possible restrictive disease","Spirometry consistent with mixed obstructive and restrictive  disease","Spirometry uninterpretable due to technique","Spirometry consistent with normal pattern","No overt abnormalities noted given today's efforts"}.  Please see  scanned spirometry results for details.  Skin Testing: {Blank single:19197::"Select foods","Environmental allergy panel","Environmental allergy panel and select foods","Food allergy panel","None","Deferred due to recent antihistamines use"}. *** Results discussed with patient/family.   Medication List:  Current Outpatient Medications  Medication Sig Dispense Refill   EPINEPHrine 0.3 mg/0.3 mL IJ SOAJ injection Inject 0.3 mg into the muscle as needed for anaphylaxis. 1 each 1   Multiple Vitamins-Minerals (HAIR SKIN NAILS) CAPS Take 1 capsule by mouth daily.     No current facility-administered medications for this visit.   Allergies: Allergies  Allergen Reactions   Shellfish Allergy Itching   I reviewed her past medical history, social history, family history, and environmental history and no significant changes have been reported from her previous visit.  Review of Systems  Constitutional:  Negative for appetite change, chills, fever and unexpected weight change.  HENT:  Positive for sneezing. Negative for congestion and rhinorrhea.        Itchy ears  Eyes:  Negative for itching.  Respiratory:  Negative for cough, chest tightness, shortness of breath and wheezing.   Cardiovascular:  Negative for chest pain.  Gastrointestinal:  Negative for abdominal pain.  Genitourinary:  Negative for difficulty urinating.  Skin:  Negative for rash.       itching  Allergic/Immunologic: Positive for environmental allergies and food allergies.    Objective: There were no vitals taken for this visit. There is no height or weight on file to calculate BMI. Physical Exam Vitals and nursing note reviewed.  Constitutional:      Appearance: Normal appearance. She is well-developed.  HENT:     Head: Normocephalic and atraumatic.      Right Ear: Tympanic membrane normal. There is impacted cerumen.     Left Ear: Tympanic membrane and external ear normal.     Nose: Nose normal.     Mouth/Throat:     Mouth: Mucous membranes are moist.     Pharynx: Oropharynx is clear.  Eyes:     Conjunctiva/sclera: Conjunctivae normal.  Cardiovascular:     Rate and Rhythm: Normal rate and regular rhythm.     Heart sounds: Normal heart sounds. No murmur heard.    No friction rub. No gallop.  Pulmonary:     Effort: Pulmonary effort is normal.     Breath sounds: Normal breath sounds. No wheezing, rhonchi or rales.  Musculoskeletal:     Cervical back: Neck supple.  Skin:    General: Skin is warm.     Findings: No rash.  Neurological:     Mental Status: She is alert and oriented to person, place, and time.  Psychiatric:        Behavior: Behavior normal.    Previous notes and tests were reviewed. The plan was reviewed with the patient/family, and all questions/concerned were addressed.  It was my pleasure to see Veronica Bradley today and participate in her care. Please feel free to contact me with any questions or concerns.  Sincerely,  Wyline Mood, DO Allergy & Immunology  [facility] office: [phone] [facility] office: [phone]

## 2022-03-20 NOTE — Progress Notes (Deleted)
Follow Up Note  RE: Veronica Bradley MRN: [id number] DOB: 04/17/1991 Date of Office Visit: 03/21/2022  Referring provider: Bonnetta Barry, PA-C Primary care provider: Bonnetta Barry, PA-C  Chief Complaint: No chief complaint on file.  History of Present Illness: I had the pleasure of seeing Veronica Bradley for a follow up visit at the [facility] on 03/20/2022. She is a 31 y.o. female, who is being followed for adverse food reaction, allergic rhinitis, atopic dermatitis. Her previous allergy office visit was on 07/26/2021 with Dr. Selena Batten. Today is a regular follow up visit.  Other adverse food reactions, not elsewhere classified, subsequent encounter Noticed itching with shrimp, peanuts and increased chocolate consumption. Symptoms resolve with antihistamines. Today's skin testing showed: Negative to select foods including shrimp peanut and chocolate.  Food allergen skin testing has excellent negative predictive value however there is still a small chance that the allergy exists. Therefore, we will investigate further with serum specific IgE levels and, if negative then schedule for open graded oral food challenge. Get bloodwork.  Start strict avoidance of shellfish, peanuts and chocolate.  I have prescribed epinephrine injectable device and demonstrated proper use. For mild symptoms you can take over the counter antihistamines such as Benadryl and monitor symptoms closely. If symptoms worsen or if you have severe symptoms including breathing issues, throat closure, significant swelling, whole body hives, severe diarrhea and vomiting, lightheadedness then inject epinephrine and seek immediate medical care afterwards. Emergency action plan given.   Other allergic rhinitis Noticed some rhinitis symptoms for the past year. Takes zyrtec prn with good benefit. Today's skin prick testing showed: Positive to grass and tree pollen. Will get bloodwork instead of intradermal testing as  ordering bloodwork as above already.  Start environmental control measures as below. Use over the counter antihistamines such as Zyrtec (cetirizine), Claritin (loratadine), Allegra (fexofenadine), or Xyzal (levocetirizine) daily as needed. May take twice a day during allergy flares. May switch antihistamines every few months.   Other atopic dermatitis History of eczema and noticed increased itching lately. See below for proper skin care. Try fragrance free and dye free products.  If no improvement, consider true patch testing next.    Return in about 6 months (around 01/24/2022).  Assessment and Plan: Veronica Bradley is a 31 y.o. female with: No problem-specific Assessment & Plan notes found for this encounter.  No follow-ups on file.  No orders of the defined types were placed in this encounter.  Lab Orders  No laboratory test(s) ordered today    Diagnostics: Spirometry:  Tracings reviewed. Her effort: {Blank single:19197::"Good reproducible efforts.","It was hard to get consistent efforts and there is a question as to whether this reflects a maximal maneuver.","Poor effort, data can not be interpreted."} FVC: ***L FEV1: ***L, ***% predicted FEV1/FVC ratio: ***% Interpretation: {Blank single:19197::"Spirometry consistent with mild obstructive disease","Spirometry consistent with moderate obstructive disease","Spirometry consistent with severe obstructive disease","Spirometry consistent with possible restrictive disease","Spirometry consistent with mixed obstructive and restrictive disease","Spirometry uninterpretable due to technique","Spirometry consistent with normal pattern","No overt abnormalities noted given today's efforts"}.  Please see scanned spirometry results for details.  Skin Testing: {Blank single:19197::"Select foods","Environmental allergy panel","Environmental allergy panel and select foods","Food allergy panel","None","Deferred due to recent antihistamines  use"}. *** Results discussed with patient/family.   Medication List:  Current Outpatient Medications  Medication Sig Dispense Refill  . EPINEPHrine 0.3 mg/0.3 mL IJ SOAJ injection Inject 0.3 mg into the muscle as needed for anaphylaxis. 1 each 1  . Multiple Vitamins-Minerals (HAIR SKIN  NAILS) CAPS Take 1 capsule by mouth daily.     No current facility-administered medications for this visit.   Allergies: Allergies  Allergen Reactions  . Shellfish Allergy Itching   I reviewed her past medical history, social history, family history, and environmental history and no significant changes have been reported from her previous visit.  Review of Systems  Constitutional:  Negative for appetite change, chills, fever and unexpected weight change.  HENT:  Positive for sneezing. Negative for congestion and rhinorrhea.        Itchy ears  Eyes:  Negative for itching.  Respiratory:  Negative for cough, chest tightness, shortness of breath and wheezing.   Cardiovascular:  Negative for chest pain.  Gastrointestinal:  Negative for abdominal pain.  Genitourinary:  Negative for difficulty urinating.  Skin:  Negative for rash.       itching  Allergic/Immunologic: Positive for environmental allergies and food allergies.   Objective: There were no vitals taken for this visit. There is no height or weight on file to calculate BMI. Physical Exam Vitals and nursing note reviewed.  Constitutional:      Appearance: Normal appearance. She is well-developed.  HENT:     Head: Normocephalic and atraumatic.     Right Ear: Tympanic membrane normal. There is impacted cerumen.     Left Ear: Tympanic membrane and external ear normal.     Nose: Nose normal.     Mouth/Throat:     Mouth: Mucous membranes are moist.     Pharynx: Oropharynx is clear.  Eyes:     Conjunctiva/sclera: Conjunctivae normal.  Cardiovascular:     Rate and Rhythm: Normal rate and regular rhythm.     Heart sounds: Normal heart  sounds. No murmur heard.    No friction rub. No gallop.  Pulmonary:     Effort: Pulmonary effort is normal.     Breath sounds: Normal breath sounds. No wheezing, rhonchi or rales.  Musculoskeletal:     Cervical back: Neck supple.  Skin:    General: Skin is warm.     Findings: No rash.  Neurological:     Mental Status: She is alert and oriented to person, place, and time.  Psychiatric:        Behavior: Behavior normal.  Previous notes and tests were reviewed. The plan was reviewed with the patient/family, and all questions/concerned were addressed.  It was my pleasure to see Veronica Bradley today and participate in her care. Please feel free to contact me with any questions or concerns.  Sincerely,  Wyline Mood, DO Allergy & Immunology  [facility] office: [phone] [facility] office: [phone]

## 2022-03-21 ENCOUNTER — Ambulatory Visit: Payer: Self-pay | Admitting: Allergy

## 2022-03-21 DIAGNOSIS — J301 Allergic rhinitis due to pollen: Secondary | ICD-10-CM

## 2022-03-21 DIAGNOSIS — T781XXD Other adverse food reactions, not elsewhere classified, subsequent encounter: Secondary | ICD-10-CM

## 2022-03-21 DIAGNOSIS — J309 Allergic rhinitis, unspecified: Secondary | ICD-10-CM

## 2022-03-21 DIAGNOSIS — L2089 Other atopic dermatitis: Secondary | ICD-10-CM

## 2022-05-30 ENCOUNTER — Telehealth: Payer: Self-pay | Admitting: *Deleted

## 2022-05-30 NOTE — Telephone Encounter (Signed)
TC to request change in time (10:15) for pt appt 06/04/22. No answer. LVM.

## 2022-06-04 ENCOUNTER — Ambulatory Visit: Payer: Self-pay

## 2022-06-04 ENCOUNTER — Ambulatory Visit: Payer: Medicaid Other | Admitting: *Deleted

## 2022-06-04 VITALS — BP 125/75 | HR 73 | Ht 61.0 in | Wt 180.6 lb

## 2022-06-04 DIAGNOSIS — Z3480 Encounter for supervision of other normal pregnancy, unspecified trimester: Secondary | ICD-10-CM

## 2022-06-04 DIAGNOSIS — O3680X0 Pregnancy with inconclusive fetal viability, not applicable or unspecified: Secondary | ICD-10-CM

## 2022-06-04 MED ORDER — PROMETHAZINE HCL 25 MG PO TABS: 25.0000 mg | ORAL_TABLET | Freq: Four times a day (QID) | ORAL | 1 refills | Status: DC | PRN

## 2022-06-04 MED ORDER — BLOOD PRESSURE KIT DEVI: 1.0000 | 0 refills | Status: DC

## 2022-06-04 MED ORDER — PRENATAL 28-0.8 MG PO TABS: 1.0000 | ORAL_TABLET | Freq: Every day | ORAL | 12 refills | Status: DC

## 2022-06-04 NOTE — Progress Notes (Signed)
New OB Intake  I connected with  Veronica Bradley on 06/04/22 at 10:15 AM EDT by In Person Visit and verified that I am speaking with the correct person using two identifiers. Nurse is located at [facility] and pt is located at [place].  I discussed the limitations, risks, security and privacy concerns of performing an evaluation and management service by telephone and the availability of in person appointments. I also discussed with the patient that there may be a patient responsible charge related to this service. The patient expressed understanding and agreed to proceed.  I explained I am completing New OB Intake today. We discussed her EDD of 01/12/23 that is based on LMP of 04/07/22. Pt is G9/P2. I reviewed her allergies, medications, Medical/Surgical/OB history, and appropriate screenings. I informed her of [facility] services. [facility] information placed in AVS. Based on history, this is a/an  pregnancy complicated by prediabets  .   Patient Active Problem List   Diagnosis Date Noted   Other adverse food reactions, not elsewhere classified, subsequent encounter 07/26/2021   Other allergic rhinitis 07/26/2021   Other atopic dermatitis 07/26/2021   Supervision of other normal pregnancy, antepartum 05/30/2016    Concerns addressed today  Delivery Plans Plans to deliver at [facility]. Patient given information for [facility] Healthy Baby website for more information about [facility] and [place]. Patient  not eligible  interested in water birth. Offered upcoming OB visit with CNM to discuss further.  MyChart/Babyscripts MyChart access verified. I explained pt will have some visits in office and some virtually. Babyscripts instructions given and order placed. Patient verifies receipt of registration text/e-mail. Account successfully created and app downloaded.  Blood Pressure Cuff/Weight Scale Blood pressure cuff ordered for patient to pick-up from [employer]. Explained after first prenatal appt pt will  check weekly and document in 58. Patient does / does not  have weight scale. Weight scale ordered for patient to pick up from [employer].   Anatomy US Explained first scheduled [place] will be around 19 weeks. Anatomy US scheduled for 19wks at MFM. Pt notified to arrive at [facility].  Labs Discussed Johnsie Cancel genetic screening with patient. Would like both Panorama and Horizon drawn at new OB visit. Routine prenatal labs needed.  Covid Vaccine Patient has covid vaccine.  Social Determinants of Health Food Insecurity: Patient denies food insecurity. WIC Referral: Patient is interested in referral to [facility].  Transportation: Patient denies transportation needs. Childcare: Discussed no children allowed at ultrasound appointments. Offered childcare services; patient declines childcare services at this time.  First visit review I reviewed new OB appt with pt. I explained she will have a provider visit that includes labs and a pelvic exam. Explained pt will be seen by Dr. Elgie Congo at first visit; encounter routed to appropriate provider. Explained that patient will be seen by pregnancy navigator following visit with provider.   Penny Pia, RN 06/04/2022  10:01 AM

## 2022-06-20 ENCOUNTER — Encounter: Payer: Medicaid Other | Admitting: Obstetrics and Gynecology

## 2022-07-16 ENCOUNTER — Ambulatory Visit (INDEPENDENT_AMBULATORY_CARE_PROVIDER_SITE_OTHER): Payer: Medicaid Other | Admitting: Advanced Practice Midwife

## 2022-07-16 ENCOUNTER — Other Ambulatory Visit (HOSPITAL_COMMUNITY)
Admission: RE | Admit: 2022-07-16 | Discharge: 2022-07-16 | Disposition: A | Discharge status: Home / Self Care | Payer: Medicaid Other | Source: Ambulatory Visit | Attending: Obstetrics and Gynecology | Admitting: Obstetrics and Gynecology

## 2022-07-16 ENCOUNTER — Encounter: Payer: Self-pay | Admitting: Advanced Practice Midwife

## 2022-07-16 ENCOUNTER — Ambulatory Visit (INDEPENDENT_AMBULATORY_CARE_PROVIDER_SITE_OTHER): Payer: Medicaid Other | Admitting: Licensed Clinical Social Worker

## 2022-07-16 VITALS — BP 124/69 | HR 83 | Wt 186.6 lb

## 2022-07-16 DIAGNOSIS — O99212 Obesity complicating pregnancy, second trimester: Secondary | ICD-10-CM

## 2022-07-16 DIAGNOSIS — Z3482 Encounter for supervision of other normal pregnancy, second trimester: Secondary | ICD-10-CM

## 2022-07-16 DIAGNOSIS — Z3480 Encounter for supervision of other normal pregnancy, unspecified trimester: Secondary | ICD-10-CM

## 2022-07-16 DIAGNOSIS — Z8759 Personal history of other complications of pregnancy, childbirth and the puerperium: Secondary | ICD-10-CM

## 2022-07-16 DIAGNOSIS — E668 Other obesity: Secondary | ICD-10-CM

## 2022-07-16 DIAGNOSIS — Z98891 History of uterine scar from previous surgery: Secondary | ICD-10-CM

## 2022-07-16 DIAGNOSIS — Z3A14 14 weeks gestation of pregnancy: Secondary | ICD-10-CM

## 2022-07-16 MED ORDER — ASPIRIN 81 MG PO CHEW: 81.0000 mg | CHEWABLE_TABLET | Freq: Every day | ORAL | 5 refills | Status: DC

## 2022-07-16 NOTE — Progress Notes (Signed)
NOB in the office for visit. Intake and U/S completed on 06/04/22

## 2022-07-16 NOTE — Progress Notes (Signed)
Subjective:   Veronica Bradley is a 31 y.o. [id number] at 74w2dby LMP being seen today for her first obstetrical visit.  Her obstetrical history is significant for  vaginal delivery with shoulder dystocia x1, then cesarean section with second pregnancy  and has Other adverse food reactions, not elsewhere classified, subsequent encounter; Other allergic rhinitis; Other atopic dermatitis; and Supervision of other normal pregnancy, antepartum on their problem list.. Patient does intend to breast feed. Pregnancy history fully reviewed.  Patient reports no complaints.  HISTORY: OB History  Gravida Para Term Preterm AB Living  _0 0 5 2  SAB IAB Ectopic Multiple Live Births  0 5 0 0 2    # Outcome Date GA Lbr Len/2nd Weight Sex Delivery Anes PTL Lv  9 Current           8 Term 08/14/12    F CS-LTranv   LIV  7 Term 2010 46w0d7 lb 6 oz (3.345 kg) F Vag-Vacuum EPI       Birth Comments: multiple vaccums, shoulder dystocia, baby intubated.   6 IAB 2007          5 IAB 2007          4 IAB 2006          3 Gravida           2 IAB           1 IAB            Past Medical History:  Diagnosis Date   Eczema    Prediabetes    Seasonal allergies    Past Surgical History:  Procedure Laterality Date   brazillian butt lift  2020   DILATION AND CURETTAGE OF UTERUS     X5   WISDOM TOOTH EXTRACTION     Family History  Problem Relation Age of Onset   Healthy Paternal Grandfather    Diabetes Maternal Grandmother    Allergic rhinitis Maternal Grandmother    Kidney failure Maternal Grandmother    Diabetes Maternal Grandfather    Kidney failure Maternal Grandfather    Eczema Father    Allergic rhinitis Mother    Social History   Tobacco Use   Smoking status: Never   Smokeless tobacco: Never  Vaping Use   Vaping Use: Never used  Substance Use Topics   Alcohol use: Not Currently    Alcohol/week: 1.0 standard drink of alcohol    Types: 1 Glasses of wine per week   Drug use: Not Currently     Types: Marijuana   Allergies  Allergen Reactions   Shellfish Allergy Itching   Current Outpatient Medications on File Prior to Visit  Medication Sig Dispense Refill   Blood Pressure Monitoring (BLOOD PRESSURE KIT) DEVI 1 Device by Does not apply route once a week. 1 each 0   EPINEPHrine 0.3 mg/0.3 mL IJ SOAJ injection Inject 0.3 mg into the muscle as needed for anaphylaxis. 1 each 1   Multiple Vitamins-Minerals (HAIR SKIN NAILS) CAPS Take 1 capsule by mouth daily.     Prenatal 28-0.8 MG TABS Take 1 tablet by mouth daily. 30 tablet 12   promethazine (PHENERGAN) 25 MG tablet Take 1 tablet (25 mg total) by mouth every 6 (six) hours as needed for nausea or vomiting. 30 tablet 1   No current facility-administered medications on file prior to visit.     Indications for ASA therapy (per uptodate) One of the following: Previous  pregnancy with preeclampsia, especially early onset and with an adverse outcome No Multifetal gestation No Chronic hypertension No Type 1 or 2 diabetes mellitus No Chronic kidney disease No Autoimmune disease (antiphospholipid syndrome, systemic lupus erythematosus) No   Two or more of the following: Nulliparity No Obesity (body mass index >30 kg/m2) Yes Family history of preeclampsia in mother or sister No Age ?35 years No Sociodemographic characteristics (African American race, low socioeconomic level) Yes Personal risk factors (eg, previous pregnancy with low birth weight or small for gestational age infant, previous adverse pregnancy outcome [eg, stillbirth], interval >10 years between pregnancies) Yes   Indications for early 1 hour GTT (per uptodate)  BMI >25 (>23 in Asian women) AND one of the following  Gestational diabetes mellitus in a previous pregnancy No Glycated hemoglobin ?5.7 percent (39 mmol/mol), impaired glucose tolerance, or impaired fasting glucose on previous testing No First-degree relative with diabetes No High-risk race/ethnicity  (eg, African American, Latino, Native American, [place] American, Pacific Islander) Yes History of cardiovascular disease No Hypertension or on therapy for hypertension No High-density lipoprotein cholesterol level <35 mg/dL (0.90 mmol/L) and/or a triglyceride level >250 mg/dL (2.82 mmol/L) No Polycystic ovary syndrome No Physical inactivity No Other clinical condition associated with insulin resistance (eg, severe obesity, acanthosis nigricans) No Previous birth of an infant weighing ?4000 g No Previous stillbirth of unknown cause No Exam   Vitals:   07/16/22 1402  BP: 124/69  Pulse: 83  Weight: 186 lb 9.6 oz (84.6 kg)   Fetal Heart Rate (bpm): 156  Uterus:     Pelvic Exam: Perineum: no hemorrhoids, normal perineum   Vulva: normal external genitalia, no lesions   Vagina:  normal mucosa, normal discharge   Cervix: no lesions and normal, pap smear done.    Adnexa: normal adnexa and no mass, fullness, tenderness   Bony Pelvis: average  System: General: well-developed, well-nourished female in no acute distress   Breast:  normal appearance, no masses or tenderness   Skin: normal coloration and turgor, no rashes   Neurologic: oriented, normal, negative, normal mood   Extremities: normal strength, tone, and muscle mass, ROM of all joints is normal   HEENT PERRLA, extraocular movement intact and sclera clear, anicteric   Mouth/Teeth mucous membranes moist, pharynx normal without lesions and dental hygiene good   Neck supple and no masses   Cardiovascular: regular rate and rhythm   Respiratory:  no respiratory distress, normal breath sounds   Abdomen: soft, non-tender; bowel sounds normal; no masses,  no organomegaly     Assessment:   Pregnancy: [id number] Patient Active Problem List   Diagnosis Date Noted   Other adverse food reactions, not elsewhere classified, subsequent encounter 07/26/2021   Other allergic rhinitis 07/26/2021   Other atopic dermatitis 07/26/2021   Supervision  of other normal pregnancy, antepartum 05/30/2016     Plan:  1. Supervision of other normal pregnancy, antepartum --Anticipatory guidance about next visits/weeks of pregnancy given.   - Cytology - PAP( [place]) - Cervicovaginal ancillary only( [place]) - Culture, OB Urine - CBC/D/Plt+RPR+Rh+ABO+RubIgG... - Babyscripts Schedule Optimization - Hemoglobin A1c  2. History of shoulder dystocia in prior pregnancy   3. History of cesarean section --Scheduled due to hx shoulder dystocia with first pregnancy --Pt desires VBAC, was initially interested in waterbirth. Discussed previous C/S as contraindication to immersion in tub but that hydrotherapy in the shower and other labor support without medications may be available.  Pt states understanding.   4. [redacted] weeks gestation  of pregnancy   5. Other obesity affecting pregnancy in first trimester  - aspirin 81 MG chewable tablet; Chew 1 tablet (81 mg total) by mouth daily.  Dispense: 30 tablet; Refill: 5    Initial labs drawn. Continue prenatal vitamins. Discussed and offered genetic screening options, including Quad screen/AFP, NIPS testing, and option to decline testing. Benefits/risks/alternatives reviewed. Pt aware that anatomy US is form of genetic screening with lower accuracy in detecting trisomies than blood work.  Pt chooses genetic screening today. NIPS: ordered. Ultrasound discussed; fetal anatomic survey: ordered. Problem list reviewed and updated. The nature of [place] with multiple MDs and other Advanced Practice Providers was explained to patient; also emphasized that residents, students are part of our team. Routine obstetric precautions reviewed. Return in about 4 weeks (around 08/13/2022) for Midwife preferred, LOB.   Fatima Blank, CNM 07/16/22 3:59 PM

## 2022-07-17 LAB — CYTOLOGY - PAP
Comment: NEGATIVE
Diagnosis: NEGATIVE
High risk HPV: NEGATIVE

## 2022-07-17 LAB — CBC/D/PLT+RPR+RH+ABO+RUBIGG...
Antibody Screen: NEGATIVE
Basophils Absolute: 0 10*3/uL (ref 0.0–0.2)
Basos: 0 %
EOS (ABSOLUTE): 0.2 10*3/uL (ref 0.0–0.4)
Eos: 2 %
HCV Ab: NONREACTIVE
HIV Screen 4th Generation wRfx: NONREACTIVE
Hematocrit: 37.3 % (ref 34.0–46.6)
Hemoglobin: 12.4 g/dL (ref 11.1–15.9)
Hepatitis B Surface Ag: NEGATIVE
Immature Grans (Abs): 0.1 10*3/uL (ref 0.0–0.1)
Immature Granulocytes: 1 %
Lymphocytes Absolute: 2 10*3/uL (ref 0.7–3.1)
Lymphs: 20 %
MCH: 27.5 pg (ref 26.6–33.0)
MCHC: 33.2 g/dL (ref 31.5–35.7)
MCV: 83 fL (ref 79–97)
Monocytes Absolute: 0.6 10*3/uL (ref 0.1–0.9)
Monocytes: 6 %
Neutrophils Absolute: 6.8 10*3/uL (ref 1.4–7.0)
Neutrophils: 71 %
Platelets: 289 10*3/uL (ref 150–450)
RBC: 4.51 x10E6/uL (ref 3.77–5.28)
RDW: 12.8 % (ref 11.7–15.4)
RPR Ser Ql: NONREACTIVE
Rh Factor: POSITIVE
Rubella Antibodies, IGG: 4.52 index (ref >0.99)
WBC: 9.6 10*3/uL (ref 3.4–10.8)

## 2022-07-17 LAB — CERVICOVAGINAL ANCILLARY ONLY
Chlamydia: NEGATIVE
Comment: NEGATIVE
Comment: NEGATIVE
Comment: NORMAL
Neisseria Gonorrhea: NEGATIVE
Trichomonas: NEGATIVE

## 2022-07-17 LAB — HEMOGLOBIN A1C
Est. average glucose Bld gHb Est-mCnc: 134 mg/dL
Hgb A1c MFr Bld: 6.3 % — ABNORMAL HIGH (ref 4.8–5.6)

## 2022-07-17 LAB — HCV INTERPRETATION

## 2022-07-17 NOTE — BH Specialist Note (Unsigned)
Integrated Behavioral Health Initial In-Person Visit  MRN: [id number] Name: Anuja Manka  Number of Integrated Behavioral Health Clinician visits: 1 Session Start time:   2:30pm Session End time: 2:45pm Total time in minutes: 15 mins in person at [facility]   Types of Service: General Behavioral Integrated Care (BHI)  Interpretor:No. Interpretor Name and Language: none   Warm Hand Off Completed.        Subjective: Salisa Gorum is a 31 y.o. female accompanied by n/a Patient was referred by L Courtney Paris for New ob intro. Patient reports the following symptoms/concerns: no concerns  Duration of problem: n/a; Severity of problem: n/a  Objective: Mood: good and Affect: Appropriate Risk of harm to self or others: No plan to harm self or others  Life Context: Family and Social: Lives with partner School/Work: n/a  Self-Care: n/a Life Changes: new pregnancy  Patient and/or Family's Strengths/Protective Factors: Concrete supports in place (healthy food, safe environments, etc.)  Goals Addressed: Patient will: Reduce symptoms of: {IBH Symptoms:21014056} Increase knowledge and/or ability of: {IBH Patient Tools:21014057}  Demonstrate ability to: {IBH Goals:21014053}  Progress towards Goals: {CHL AMB BH PROGRESS TOWARDS GOALS:7194291391}  Interventions: Interventions utilized: {IBH Interventions:21014054}  Standardized Assessments completed: {IBH Screening Tools:21014051}  Patient and/or Family Response: ***  Patient Centered Plan: Patient is on the following Treatment Plan(s):  ***  Assessment: Patient currently experiencing ***.   Patient may benefit from ***.  Plan: Follow up with behavioral health clinician on : *** Behavioral recommendations: *** Referral(s): {IBH Referrals:21014055} "From scale of 1-10, how likely are you to follow plan?": ***  Gwyndolyn Saxon, LCSW

## 2022-07-18 LAB — URINE CULTURE, OB REFLEX: Organism ID, Bacteria: NO GROWTH

## 2022-07-18 LAB — CULTURE, OB URINE

## 2022-07-20 ENCOUNTER — Ambulatory Visit (INDEPENDENT_AMBULATORY_CARE_PROVIDER_SITE_OTHER): Payer: Medicaid Other

## 2022-07-20 ENCOUNTER — Other Ambulatory Visit (HOSPITAL_COMMUNITY)
Admission: RE | Admit: 2022-07-20 | Discharge: 2022-07-20 | Disposition: A | Discharge status: Home / Self Care | Payer: Medicaid Other | Source: Ambulatory Visit | Attending: Obstetrics and Gynecology | Admitting: Obstetrics and Gynecology

## 2022-07-20 DIAGNOSIS — N898 Other specified noninflammatory disorders of vagina: Secondary | ICD-10-CM | POA: Diagnosis present

## 2022-07-20 NOTE — Progress Notes (Signed)
..  SUBJECTIVE:  31 y.o. female complains of white vaginal discharge and odor for 2 week(s). Denies abnormal vaginal bleeding or significant pelvic pain or fever. No UTI symptoms. Denies history of known exposure to STD.  Patient's last menstrual period was 04/07/2022 (approximate).  OBJECTIVE: Pt was in office on Monday for NOB, urine culture and vaginal swab was performed for STDs but not BV and yeast.  Urine culture results were normal She appears well, afebrile. Urine dipstick: not done.  ASSESSMENT:  Vaginal Discharge  Vaginal Odor   PLAN:  BVAG, CVAG probe sent to lab. Treatment: To be determined once lab results are received ROV prn if symptoms persist or worsen.

## 2022-07-23 ENCOUNTER — Other Ambulatory Visit: Payer: Self-pay | Admitting: *Deleted

## 2022-07-23 DIAGNOSIS — B3731 Acute candidiasis of vulva and vagina: Secondary | ICD-10-CM

## 2022-07-23 LAB — PANORAMA PRENATAL TEST FULL PANEL:PANORAMA TEST PLUS 5 ADDITIONAL MICRODELETIONS: FETAL FRACTION: 9.4

## 2022-07-23 LAB — CERVICOVAGINAL ANCILLARY ONLY
Bacterial Vaginitis (gardnerella): NEGATIVE
Candida Glabrata: NEGATIVE
Candida Vaginitis: POSITIVE — AB
Comment: NEGATIVE
Comment: NEGATIVE
Comment: NEGATIVE

## 2022-07-23 MED ORDER — TERCONAZOLE 0.8 % VA CREA: 1.0000 | TOPICAL_CREAM | Freq: Every day | VAGINAL | 0 refills | Status: DC

## 2022-07-24 NOTE — Progress Notes (Signed)
TC. Pt aware of yeast infection. Will pick up [employer].

## 2022-07-25 LAB — HORIZON CUSTOM: REPORT SUMMARY: POSITIVE — AB

## 2022-08-05 ENCOUNTER — Encounter (HOSPITAL_BASED_OUTPATIENT_CLINIC_OR_DEPARTMENT_OTHER): Payer: Self-pay | Admitting: Advanced Practice Midwife

## 2022-08-05 DIAGNOSIS — D563 Thalassemia minor: Secondary | ICD-10-CM | POA: Insufficient documentation

## 2022-08-05 DIAGNOSIS — D573 Sickle-cell trait: Secondary | ICD-10-CM | POA: Insufficient documentation

## 2022-08-05 DIAGNOSIS — Z148 Genetic carrier of other disease: Secondary | ICD-10-CM | POA: Insufficient documentation

## 2022-08-17 ENCOUNTER — Ambulatory Visit (INDEPENDENT_AMBULATORY_CARE_PROVIDER_SITE_OTHER): Payer: Medicaid Other | Admitting: Obstetrics and Gynecology

## 2022-08-17 ENCOUNTER — Encounter: Payer: Medicaid Other | Admitting: Obstetrics and Gynecology

## 2022-08-17 ENCOUNTER — Other Ambulatory Visit: Payer: Medicaid Other

## 2022-08-17 VITALS — BP 108/70 | HR 81 | Wt 188.0 lb

## 2022-08-17 DIAGNOSIS — Z8759 Personal history of other complications of pregnancy, childbirth and the puerperium: Secondary | ICD-10-CM | POA: Insufficient documentation

## 2022-08-17 DIAGNOSIS — Z3A18 18 weeks gestation of pregnancy: Secondary | ICD-10-CM

## 2022-08-17 DIAGNOSIS — Z3480 Encounter for supervision of other normal pregnancy, unspecified trimester: Secondary | ICD-10-CM

## 2022-08-17 DIAGNOSIS — Z3482 Encounter for supervision of other normal pregnancy, second trimester: Secondary | ICD-10-CM

## 2022-08-17 DIAGNOSIS — Z98891 History of uterine scar from previous surgery: Secondary | ICD-10-CM | POA: Insufficient documentation

## 2022-08-17 NOTE — Progress Notes (Signed)
Pt ate after midnight - will need to reschedule GTT. Pt had some pain with recent intercourse, otherwise doing well.

## 2022-08-17 NOTE — Progress Notes (Signed)
   PRENATAL VISIT NOTE  Subjective:  Veronica Bradley is a 31 y.o. [id number] at [redacted]w[redacted]d being seen today for ongoing prenatal care.  She is currently monitored for the following issues for this high-risk pregnancy and has Other allergic rhinitis; Other atopic dermatitis; Supervision of other normal pregnancy, antepartum; Alpha thalassemia silent carrier; Carrier of spinal muscular atrophy; Sickle cell trait (HCC); Prior cesarean delivery; and History of shoulder dystocia in prior pregnancy on their problem list.  Patient reports  she is doing well overall .  Contractions: Not present. Vag. Bleeding: None.  Movement: Absent. Denies leaking of fluid.   The following portions of the patient's history were reviewed and updated as appropriate: allergies, current medications, past family history, past medical history, past social history, past surgical history and problem list.   Objective:   Vitals:   08/17/22 0903  BP: 108/70  Pulse: 81  Weight: 188 lb (85.3 kg)   Fetal Status: Fetal Heart Rate (bpm): 155   Movement: Absent     General:  Alert, oriented and cooperative. Patient is in no acute distress.  Skin: Skin is warm and dry. No rash noted.   Cardiovascular: Normal heart rate noted  Respiratory: Normal respiratory effort, no problems with respiration noted  Abdomen: Soft, gravid, appropriate for gestational age.  Pain/Pressure: Absent      Assessment and Plan:  Pregnancy: [id number] at [redacted]w[redacted]d 1. Supervision of other normal pregnancy, antepartum Forgot to fast before this appointment - will make lab appt for 2h GTT & AFP Anatomy US to be scheduled - [place] MFM OB DETAIL +14 WK; Future - AFP, Serum, Open Spina Bifida  2. History of cesarean delivery & shoulder dystocia Did not discuss MOD in depth today, but pt interested in [facility] Delivery note reviewed from [place] in G1 pregnancy - VAVD for NRFHT, then 30s SD requiring [place], [place], suprapubic pressure > episiotomy, shoulder shrug with  delivery of posterior arm & suprapubic pressure for delivery. Infant weight 3345g   Return in about 4 weeks (around 09/14/2022) for return OB.  Future Appointments  Date Time Provider Department Center  08/23/2022  8:30 AM [facility] LAB [facility] None  09/13/2022  8:15 AM [facility] NURSE [facility]  09/13/2022  8:30 AM [facility] US3 [facility]  09/17/2022 11:15 AM Mercado-Ortiz, Lahoma Crocker, DO [facility] None   Lennart Pall, MD

## 2022-08-20 NOTE — L&D Delivery Note (Signed)
Delivery Note ROM x 12h 74m. Pt pushed x 27 minutes.  At 12:43 PM a viable, healthy, and vigorous  female was delivered via Vaginal, Spontaneous (Presentation: Left Occiput Anterior). Shoulders delivered easily. Infant placed skin-to-skin w/ mom. Delayed cord clamping x 2 minutes. Cord clamped x 2 and cut by FOB. APGAR: 9, 9; weight 6 lb 10.2 oz (3010 g).   Placenta status: Spontaneous, Intact.  Cord: 3 vessels with the following complications: None.  Cord pH: NA  Anesthesia: Epidural Episiotomy: None Lacerations: None Suture Repair:  NA Est. Blood Loss (mL): 475  Mom to postpartum.  Baby to Couplet care / Skin to Skin. Placenta to: LD Feeding: Breast (pump) Circ: IP Contraception: IP Depo  [place] 12/27/2022, 2:54 PM

## 2022-08-23 ENCOUNTER — Other Ambulatory Visit: Payer: Medicaid Other

## 2022-08-23 DIAGNOSIS — O0990 Supervision of high risk pregnancy, unspecified, unspecified trimester: Secondary | ICD-10-CM

## 2022-08-24 LAB — GLUCOSE TOLERANCE, 2 HOURS W/ 1HR
Glucose, 1 hour: 158 mg/dL (ref 70–179)
Glucose, 2 hour: 164 mg/dL — ABNORMAL HIGH (ref 70–152)
Glucose, Fasting: 94 mg/dL — ABNORMAL HIGH (ref 70–91)

## 2022-08-25 LAB — AFP, SERUM, OPEN SPINA BIFIDA
AFP MoM: 2.16
AFP Value: 102.7 ng/mL
Gest. Age on Collection Date: 19.7 weeks
Maternal Age At EDD: 33.4 yr
OSBR Risk 1 IN: 569
Test Results:: NEGATIVE
Weight: 190 [lb_av]

## 2022-09-04 ENCOUNTER — Other Ambulatory Visit: Payer: Self-pay | Admitting: *Deleted

## 2022-09-04 DIAGNOSIS — O24419 Gestational diabetes mellitus in pregnancy, unspecified control: Secondary | ICD-10-CM

## 2022-09-04 MED ORDER — ACCU-CHEK GUIDE W/DEVICE KIT: 1.0000 | PACK | Freq: Four times a day (QID) | 0 refills | Status: DC

## 2022-09-04 MED ORDER — ACCU-CHEK SOFTCLIX LANCETS MISC: 1.0000 | Freq: Four times a day (QID) | 12 refills | Status: DC

## 2022-09-04 MED ORDER — ACCU-CHEK GUIDE VI STRP: ORAL_STRIP | 12 refills | Status: DC

## 2022-09-04 NOTE — Progress Notes (Signed)
RX meter and supplies sent. Referral to GDM educator sent. TC to pt. Advised of GDM, RX meter and supplies, and referral to educator. Advised to pick up meter before appt with educator. Pt verbalized understanding and all questions were answered.  MyChart education on GDM sent.

## 2022-09-12 ENCOUNTER — Encounter: Payer: Medicaid Other | Attending: Physician Assistant | Admitting: Registered"

## 2022-09-12 DIAGNOSIS — O24419 Gestational diabetes mellitus in pregnancy, unspecified control: Secondary | ICD-10-CM | POA: Diagnosis present

## 2022-09-12 NOTE — Progress Notes (Unsigned)
This patient is accompanied in the office by her spouse.  The following learning objectives were met by the patient during this course:   States the definition of Gestational Diabetes States why dietary management is important in controlling blood glucose Describes the effects each nutrient has on blood glucose levels Demonstrates ability to create a balanced meal plan Demonstrates carbohydrate counting  States when to check blood glucose levels Demonstrates proper blood glucose monitoring techniques States the effect of stress and exercise on blood glucose levels States the importance of limiting caffeine and abstaining from alcohol and smoking   Blood glucose monitor given: None. Patient has meter and checking blood sugar prior to class.   Patient instructed to monitor glucose levels: FBS: 60 - <95; 1 hour: <140; 2 hour: <120   Patient received handouts: Nutrition Diabetes and Pregnancy, including carb counting list Glucose log sheet   Patient will be seen for follow-up as needed.

## 2022-09-13 ENCOUNTER — Other Ambulatory Visit: Payer: Self-pay | Admitting: *Deleted

## 2022-09-13 ENCOUNTER — Encounter: Payer: Self-pay | Admitting: Registered"

## 2022-09-13 ENCOUNTER — Ambulatory Visit: Payer: Medicaid Other | Admitting: *Deleted

## 2022-09-13 ENCOUNTER — Encounter: Payer: Self-pay | Admitting: *Deleted

## 2022-09-13 ENCOUNTER — Ambulatory Visit: Payer: Medicaid Other | Attending: Obstetrics and Gynecology

## 2022-09-13 VITALS — BP 110/56 | HR 102

## 2022-09-13 DIAGNOSIS — D569 Thalassemia, unspecified: Secondary | ICD-10-CM | POA: Insufficient documentation

## 2022-09-13 DIAGNOSIS — Z362 Encounter for other antenatal screening follow-up: Secondary | ICD-10-CM

## 2022-09-13 DIAGNOSIS — O99212 Obesity complicating pregnancy, second trimester: Secondary | ICD-10-CM | POA: Insufficient documentation

## 2022-09-13 DIAGNOSIS — D573 Sickle-cell trait: Secondary | ICD-10-CM | POA: Insufficient documentation

## 2022-09-13 DIAGNOSIS — Z3689 Encounter for other specified antenatal screening: Secondary | ICD-10-CM | POA: Insufficient documentation

## 2022-09-13 DIAGNOSIS — Z3480 Encounter for supervision of other normal pregnancy, unspecified trimester: Secondary | ICD-10-CM | POA: Diagnosis present

## 2022-09-13 DIAGNOSIS — O09292 Supervision of pregnancy with other poor reproductive or obstetric history, second trimester: Secondary | ICD-10-CM | POA: Insufficient documentation

## 2022-09-13 DIAGNOSIS — O24410 Gestational diabetes mellitus in pregnancy, diet controlled: Secondary | ICD-10-CM | POA: Insufficient documentation

## 2022-09-13 DIAGNOSIS — O24419 Gestational diabetes mellitus in pregnancy, unspecified control: Secondary | ICD-10-CM | POA: Insufficient documentation

## 2022-09-13 DIAGNOSIS — Z363 Encounter for antenatal screening for malformations: Secondary | ICD-10-CM | POA: Insufficient documentation

## 2022-09-13 DIAGNOSIS — O34219 Maternal care for unspecified type scar from previous cesarean delivery: Secondary | ICD-10-CM

## 2022-09-13 DIAGNOSIS — O99012 Anemia complicating pregnancy, second trimester: Secondary | ICD-10-CM | POA: Diagnosis not present

## 2022-09-13 DIAGNOSIS — Z3A22 22 weeks gestation of pregnancy: Secondary | ICD-10-CM | POA: Diagnosis not present

## 2022-09-13 DIAGNOSIS — O09299 Supervision of pregnancy with other poor reproductive or obstetric history, unspecified trimester: Secondary | ICD-10-CM

## 2022-09-13 DIAGNOSIS — R7309 Other abnormal glucose: Secondary | ICD-10-CM

## 2022-09-13 DIAGNOSIS — R638 Other symptoms and signs concerning food and fluid intake: Secondary | ICD-10-CM

## 2022-09-17 ENCOUNTER — Encounter: Payer: Self-pay | Admitting: Family Medicine

## 2022-09-17 ENCOUNTER — Ambulatory Visit (INDEPENDENT_AMBULATORY_CARE_PROVIDER_SITE_OTHER): Payer: Medicaid Other | Admitting: Family Medicine

## 2022-09-17 VITALS — BP 109/68 | HR 94 | Wt 190.8 lb

## 2022-09-17 DIAGNOSIS — O24410 Gestational diabetes mellitus in pregnancy, diet controlled: Secondary | ICD-10-CM

## 2022-09-17 DIAGNOSIS — Z8759 Personal history of other complications of pregnancy, childbirth and the puerperium: Secondary | ICD-10-CM

## 2022-09-17 DIAGNOSIS — Z3480 Encounter for supervision of other normal pregnancy, unspecified trimester: Secondary | ICD-10-CM

## 2022-09-17 DIAGNOSIS — Z98891 History of uterine scar from previous surgery: Secondary | ICD-10-CM

## 2022-09-17 DIAGNOSIS — Z3A23 23 weeks gestation of pregnancy: Secondary | ICD-10-CM

## 2022-09-17 NOTE — Progress Notes (Signed)
PRENATAL VISIT NOTE  Subjective:  Story Veronica Bradley is a 32 y.o. [id number] at [redacted]w[redacted]d being seen today for ongoing prenatal care.  She is currently monitored for the following issues for this low-risk pregnancy and has Other allergic rhinitis; Other atopic dermatitis; Supervision of other normal pregnancy, antepartum; Alpha thalassemia silent carrier; Carrier of spinal muscular atrophy; Sickle cell trait ([place]); Prior cesarean delivery; History of shoulder dystocia in prior pregnancy; and Gestational diabetes mellitus (GDM), antepartum on their problem list.  Patient reports no complaints. Would ike to know more anout alcohol use in pregnancy. Has not used alcohol.  Contractions: Not present. Vag. Bleeding: None.  Movement: Present. Denies leaking of fluid.   The following portions of the patient's history were reviewed and updated as appropriate: allergies, current medications, past family history, past medical history, past social history, past surgical history and problem list.   Objective:   Vitals:   09/17/22 1116  BP: 109/68  Pulse: 94  Weight: 190 lb 12.8 oz (86.5 kg)    Fetal Status: Fetal Heart Rate (bpm): 155   Movement: Present     General:  Alert, oriented and cooperative. Patient is in no acute distress.  Skin: Skin is warm and dry. No rash noted.   Cardiovascular: Normal heart rate noted  Respiratory: Normal respiratory effort, no problems with respiration noted  Abdomen: Soft, gravid, appropriate for gestational age.  Pain/Pressure: Absent     Pelvic: Cervical exam deferred        Extremities: Normal range of motion.  Edema: Trace  Mental Status: Normal mood and affect. Normal behavior. Normal judgment and thought content.   Assessment and Plan:  Pregnancy: [id number] at [redacted]w[redacted]d  1. Supervision of other normal pregnancy, antepartum  2. Diet controlled gestational diabetes mellitus (GDM), antepartum  3. History of shoulder dystocia in prior pregnancy  4. Prior cesarean  delivery   TOLAC consent signed Nursing Staff Provider  Office Location  [facility] Dating  01/12/2023, by Last Menstrual Period  [facility] Model Valu.Nieves ] Traditional [ ]  Centering [ ]  Mom-Baby Dyad Anatomy US  Normal  Language   English    Flu Vaccine   Genetic/Carrier Screen  NIPS:   LR female AFP:   neg [place]: Alpha thal, SMA, & SCT  TDaP Vaccine    Hgb A1C or  GTT Early 6.3 -- elevated 2h  Third trimester   COVID Vaccine  Yes   LAB RESULTS   Rhogam  B/Positive/-- (11/27 1437)  Blood Type B/Positive/-- (11/27 1437)   Baby Feeding Plan  Breast Antibody Negative (11/27 1437)  Contraception  Depo Rubella 4.52 (11/27 1437)  Circumcision  Yes if female RPR Non Reactive (11/27 1437)   Pediatrician    HBsAg Negative (11/27 1437)   Support Person  Winferd Humphrey HCVAb Non Reactive (11/27 1437)   Prenatal Classes  HIV Non Reactive (11/27 1437)     BTL Consent  GBS   (For PCN allergy, check sensitivities)   VBAC Consent  Pap        DME Rx Valu.Nieves ] BP cuff [ ]  Weight Scale Waterbirth  [ ]  Class [ ]  Consent [ ]  CNM visit  PHQ9 & GAD7 [ X ] new OB [  ] 28 weeks  [  ] 36 weeks Induction  [ ]  Orders Entered [ ] Foley Y/N    Preterm labor symptoms and general obstetric precautions including but not limited to vaginal bleeding, contractions, leaking of fluid and fetal movement were reviewed in detail with the patient.  Please refer to After Visit Summary for other counseling recommendations.     Future Appointments  Date Time Provider [place]  10/11/2022  8:30 AM [facility] NURSE [facility]  10/11/2022  8:45 AM [facility] US6 [facility]  10/15/2022 11:15 AM Inez Catalina, MD [place] None  10/29/2022  9:55 AM Constant, Vickii Chafe, MD [place] None  11/12/2022  9:55 AM Inez Catalina, MD [place] None    Shelda Pal, DO FMO[facility] Fellow, Faculty practice [place] [facility] [employer] 09/20/22  2:43 PM

## 2022-09-17 NOTE — Progress Notes (Signed)
Pt presents for ROB visit. Pt compliant with checking blood sugars. Blood sugars have been in range. Pt asking if it's OK to drink red wine while pregnant. No concerns at this time.

## 2022-10-02 ENCOUNTER — Telehealth: Payer: Self-pay | Admitting: Emergency Medicine

## 2022-10-02 NOTE — Telephone Encounter (Signed)
TC to patient regarding pharmacy request for diabetes supplies. Pt states she has all supplies and does not need a refill at this time.

## 2022-10-05 ENCOUNTER — Other Ambulatory Visit: Payer: Self-pay

## 2022-10-05 DIAGNOSIS — O24410 Gestational diabetes mellitus in pregnancy, diet controlled: Secondary | ICD-10-CM

## 2022-10-05 MED ORDER — BLOOD PRESSURE KIT DEVI: 1.0000 | 0 refills | Status: DC

## 2022-10-11 ENCOUNTER — Other Ambulatory Visit: Payer: Self-pay | Admitting: *Deleted

## 2022-10-11 ENCOUNTER — Ambulatory Visit: Payer: Medicaid Other | Admitting: *Deleted

## 2022-10-11 ENCOUNTER — Ambulatory Visit: Payer: Medicaid Other | Attending: Maternal & Fetal Medicine

## 2022-10-11 VITALS — BP 115/60 | HR 87

## 2022-10-11 DIAGNOSIS — Z3A26 26 weeks gestation of pregnancy: Secondary | ICD-10-CM

## 2022-10-11 DIAGNOSIS — O24410 Gestational diabetes mellitus in pregnancy, diet controlled: Secondary | ICD-10-CM

## 2022-10-11 DIAGNOSIS — R7309 Other abnormal glucose: Secondary | ICD-10-CM | POA: Insufficient documentation

## 2022-10-11 DIAGNOSIS — O99212 Obesity complicating pregnancy, second trimester: Secondary | ICD-10-CM

## 2022-10-11 DIAGNOSIS — Z362 Encounter for other antenatal screening follow-up: Secondary | ICD-10-CM | POA: Diagnosis present

## 2022-10-11 DIAGNOSIS — O34219 Maternal care for unspecified type scar from previous cesarean delivery: Secondary | ICD-10-CM | POA: Diagnosis present

## 2022-10-11 DIAGNOSIS — O09299 Supervision of pregnancy with other poor reproductive or obstetric history, unspecified trimester: Secondary | ICD-10-CM | POA: Insufficient documentation

## 2022-10-11 DIAGNOSIS — Z148 Genetic carrier of other disease: Secondary | ICD-10-CM

## 2022-10-11 DIAGNOSIS — O09292 Supervision of pregnancy with other poor reproductive or obstetric history, second trimester: Secondary | ICD-10-CM

## 2022-10-11 DIAGNOSIS — R638 Other symptoms and signs concerning food and fluid intake: Secondary | ICD-10-CM | POA: Insufficient documentation

## 2022-10-11 DIAGNOSIS — D573 Sickle-cell trait: Secondary | ICD-10-CM

## 2022-10-11 DIAGNOSIS — E669 Obesity, unspecified: Secondary | ICD-10-CM

## 2022-10-15 ENCOUNTER — Ambulatory Visit (INDEPENDENT_AMBULATORY_CARE_PROVIDER_SITE_OTHER): Payer: Medicaid Other | Admitting: Obstetrics and Gynecology

## 2022-10-15 ENCOUNTER — Encounter: Payer: Self-pay | Admitting: Obstetrics and Gynecology

## 2022-10-15 VITALS — BP 118/75 | HR 94 | Wt 194.0 lb

## 2022-10-15 DIAGNOSIS — Z3480 Encounter for supervision of other normal pregnancy, unspecified trimester: Secondary | ICD-10-CM

## 2022-10-15 DIAGNOSIS — Z98891 History of uterine scar from previous surgery: Secondary | ICD-10-CM

## 2022-10-15 DIAGNOSIS — Z148 Genetic carrier of other disease: Secondary | ICD-10-CM

## 2022-10-15 DIAGNOSIS — O24410 Gestational diabetes mellitus in pregnancy, diet controlled: Secondary | ICD-10-CM

## 2022-10-15 DIAGNOSIS — Z8759 Personal history of other complications of pregnancy, childbirth and the puerperium: Secondary | ICD-10-CM

## 2022-10-15 DIAGNOSIS — Z3A27 27 weeks gestation of pregnancy: Secondary | ICD-10-CM

## 2022-10-15 DIAGNOSIS — D573 Sickle-cell trait: Secondary | ICD-10-CM

## 2022-10-15 DIAGNOSIS — D563 Thalassemia minor: Secondary | ICD-10-CM

## 2022-10-15 DIAGNOSIS — G5600 Carpal tunnel syndrome, unspecified upper limb: Secondary | ICD-10-CM

## 2022-10-15 DIAGNOSIS — O26899 Other specified pregnancy related conditions, unspecified trimester: Secondary | ICD-10-CM

## 2022-10-15 NOTE — Progress Notes (Signed)
Pt presents for ROB visit. Pt reports numbness and tingling in her hands. She states she can't feel them if she sleeps on her side. No other concerns at this time.

## 2022-10-15 NOTE — Progress Notes (Signed)
   PRENATAL VISIT NOTE  Subjective:  Veronica Bradley is a 32 y.o. [id number] at 78w2dbeing seen today for ongoing prenatal care.  She is currently monitored for the following issues for this high-risk pregnancy and has Other allergic rhinitis; Other atopic dermatitis; Supervision of other normal pregnancy, antepartum; Alpha thalassemia silent carrier; Carrier of spinal muscular atrophy; [place]; Prior cesarean delivery; History of shoulder dystocia in prior pregnancy; and Gestational diabetes mellitus (GDM), antepartum on their problem list.  Patient reports  she is doing well overall. Having numbness & tingling in her hands esp after sleeping on her sides at night. No weakness or issues with grip .  Contractions: Not present. Vag. Bleeding: None.  Movement: Present. Denies leaking of fluid.   The following portions of the patient's history were reviewed and updated as appropriate: allergies, current medications, past family history, past medical history, past social history, past surgical history and problem list.   Objective:   Vitals:   10/15/22 1106  BP: 118/75  Pulse: 94  Weight: 194 lb (88 kg)   Fetal Status: Fetal Heart Rate (bpm): 147   Movement: Present     General:  Alert, oriented and cooperative. Patient is in no acute distress.  Skin: Skin is warm and dry. No rash noted.   Cardiovascular: Normal heart rate noted  Respiratory: Normal respiratory effort, no problems with respiration noted  Abdomen: Soft, gravid, appropriate for gestational age.  Pain/Pressure: Absent      Assessment and Plan:  Pregnancy: [id number] 245w2d. Supervision of other normal pregnancy, antepartum 2. [redacted] weeks gestation of pregnancy Tdap, CBC, HIV/RPR next appt  3. Diet controlled gestational diabetes mellitus (GDM), antepartum BG reviewed and all normal Growth USKoreacheduled for 28 weeks  4. History of shoulder dystocia in prior pregnancy 5. Prior cesarean delivery Desires TOLAC,  consent signed at previous visit  6. Sickle cell trait ([facility]) 7. Carrier of spinal muscular atrophy 8. Alpha thalassemia silent carrier Awaiting partner testing  9. Carpal tunnel syndrome Reviewed diagnosis & discussed bracing at night time to assist with symptoms  Return in about 2 weeks (around 10/29/2022) for return OB at 29 weeks with CBC, RPR, HIV & tdap.  Future Appointments  Date Time Provider [place]3/06/2023  9:55 AM Constant, PeVickii ChafeMD [facility]  11/12/2022  9:55 AM FoInez CatalinaMD [facility] None  11/14/2022  8:30 AM [facility] NURSE [facility]  8:45 AM [facility] US5 [facility]  KyInez CatalinaMD

## 2022-10-19 ENCOUNTER — Other Ambulatory Visit: Payer: Self-pay | Admitting: *Deleted

## 2022-10-19 DIAGNOSIS — O99212 Obesity complicating pregnancy, second trimester: Secondary | ICD-10-CM

## 2022-10-19 DIAGNOSIS — O09299 Supervision of pregnancy with other poor reproductive or obstetric history, unspecified trimester: Secondary | ICD-10-CM

## 2022-10-19 DIAGNOSIS — O24410 Gestational diabetes mellitus in pregnancy, diet controlled: Secondary | ICD-10-CM

## 2022-10-29 ENCOUNTER — Encounter: Payer: Self-pay | Admitting: Obstetrics and Gynecology

## 2022-10-29 ENCOUNTER — Other Ambulatory Visit (HOSPITAL_COMMUNITY)
Admission: RE | Admit: 2022-10-29 | Discharge: 2022-10-29 | Disposition: A | Discharge status: Home / Self Care | Payer: Medicaid Other | Source: Ambulatory Visit | Attending: Obstetrics and Gynecology | Admitting: Obstetrics and Gynecology

## 2022-10-29 ENCOUNTER — Ambulatory Visit (INDEPENDENT_AMBULATORY_CARE_PROVIDER_SITE_OTHER): Payer: Medicaid Other | Admitting: Obstetrics and Gynecology

## 2022-10-29 VITALS — BP 111/76 | HR 114 | Wt 194.0 lb

## 2022-10-29 DIAGNOSIS — Z98891 History of uterine scar from previous surgery: Secondary | ICD-10-CM

## 2022-10-29 DIAGNOSIS — Z8759 Personal history of other complications of pregnancy, childbirth and the puerperium: Secondary | ICD-10-CM

## 2022-10-29 DIAGNOSIS — Z3480 Encounter for supervision of other normal pregnancy, unspecified trimester: Secondary | ICD-10-CM | POA: Diagnosis present

## 2022-10-29 DIAGNOSIS — O24410 Gestational diabetes mellitus in pregnancy, diet controlled: Secondary | ICD-10-CM

## 2022-10-29 DIAGNOSIS — Z3A29 29 weeks gestation of pregnancy: Secondary | ICD-10-CM

## 2022-10-29 NOTE — Patient Instructions (Signed)
[place] [facility] Providers  Central/[place]outheast [place] ([address]) [facility] Brown, MD; Chambliss, MD; Eniola, MD; Hensel, MD; McDiarmid, MD; McIntyer, MD [address] [place], [place] [address] [phone] Mon-Fri 8:30-12:30, 1:30-5:00  Providers come to see babies during newborn hospitalization Only accepting infants of Mother's who are seen at Family Medicine Center or have siblings seen at   [facility] Center Medicaid - Yes; Tricare - Yes   [facility] Mulberry, MD [address] [place], [place] [address] [phone] Mon, Tue, Thur, Fri 8:30-5:00, Wed 10:00-7:00 (closed 1-2pm daily for lunch) Takes [place] residents with no insurance.  [place] only with Medicaid/insurance; Tricare - no  [facility] Ben-Davies, MD; Brown, MD; Chandler, MD; Ettefagh, MD; Grant, MD; Hanvey, MD; Herrin, MD; Jones,  MD; Lester, MD; McCormick, MD; McQueen, MD; Simha, MD; Stanley, MD; Stryffeler, NP [address], [place], [place] [address] [phone] Mon, Tue, Thur, Fri 8:30-5:30, Wed 9:30-5:30, Sat 8:30-12:30 Only accepting infants of first-time parents or siblings of current patients Hospital discharge coordinator will make follow-up appointment Medicaid - yes; Tricare - yes  [place] ([address]) [facility] Cox, MD; Davis, MD; Dovico, MD; Ettefaugh, MD; Lowe, MD; Nation, MD; Slimp, MD; Sumner, MD; Williams, MD [address], [place], [place] [address] [phone] Mon-Fri 8:30-5:00, closed for lunch 12:30-1:30; Sat-Sun 10:00-1:00 Accepting Newborns with commercial insurance only, must call prior to delivery to be accepted into  practice.  Medicaid - no, Tricare - yes   [facility] [address] [place], [place] [address] [phone] or [phone] Mon to Fri 8am to 10pm, Sat 8am to 1pm  (virtual only on weekends) Only accepts Medicaid Healthy Blue pts  [facility]  Artis, MD; Coccaro, MD; Lockett Gardner, MD; Netherton, NP; Roper, MD; Wilmot, PA-C; Skinner, MD [address] [place], [place] [address] [phone] Mon-Fri 8:30-5:30 Medicaid - yes, Tricare - yes  [place] ([address]) [facility] Warner, MD [address], [place], [place] [address] [phone] Mon, Tues, Wed Fri 8:30-5:00, Sat 8:30-12:00, Closed Thursdays Accepting siblings of established patients and first time mom's if you call prenatally Medicaid- yes; Tricare - yes  [facility] Becker, PA; Hagler, MD; Quinn, PA-C; Scifres, PA; Sun, MD; Swayne, MD;  [address], [place], [place] [address] [phone] Mon-Fri 8:30-5:00, closed for lunch 1-2 Only accepting newborns of established patients Medicaid- no; Tricare - yes  [place] ([address]) [facility] Timberlake, MD; [address], [place], [place] [address] [phone] Mon-Fri 8:00-5:00 Medicaid - No; Tricare - Yes  [facility] [employer]  Brake, NP; Wharton, PA [address], [place], [place] [address] [phone] Mon-Fri 8:00-5:00 Medicaid - No, Tricare - Yes  [facility] Gay, MD; Quinlan, MD; Blatt, DNP [address] [place], [place] [address] [phone]  Mon-Fri 8:00-5:00 Medicaid - No; Tricare - Yes  [facility] [address] [place], [place] [address] [phone] Mon-Fri 8:30-5:00 (lunch 12:00-1:00) Medicaid -Yes; Tricare - Yes  [facility] Jordan, MD [address], [place], [place] [address] [phone] Mon-Fri 8:00-5:00 Seeing newborns of current patients only. No new patients Medicaid - No, Tricare - yes  [facility] Parker, MD [address] , [place] [address] [phone] Mon-Fri 8:00-5:00 Medicaid -yes as secondary coverage only;   Tricare - yes  [facility] Brecken, PA; Christy, NP; Dees, MD; DeClaire, MD; DeWeese, MD; Hodge, PA; Smoot, NP; Summer, MD; Vapne, MD [address] [place], [place] [address] [phone] Mon-Fri 8:30-5:00, Sat 9:00-11:00 Accepts commercial insurance ONLY. Offers free prenatal information sessions for families. Medicaid - No, Tricare - Call first  [facility] Bouska, MD; Gordon, PA; Jeffery, PA; Weber, PA [address] [place] [address] [phone] Mon-Fri 7:30-5:30 Medicaid - Yes; Tricare - yes  [place] ([address] & [address])  [facility] Reese, MD [address] [place], [place] [address] [phone] Mon-Thur 8:00-6:00, closed for lunch 12-2, closed Fridays Medicaid - yes; Tricare - no  [facility] Anderson, NP; Badger, MD; Beal, PA; Spencer, PA [address], [place], [place] [address] [phone] Mon-Fri 7:30-4:30 Medicaid - yes, Tricare - yes  [facility]  Agbuya, MD; Klett, NP; Romgoolam, MD; Rothstein, NP [address], [place], [place] [address] [phone] Mon-Fri 8:30-5:00, closed for lunch 1-2, Sat 8:30-12:00 - sick visits only Providers come to see babies at [facility] Only accepting newborns of siblings and first time parents ONLY if who have met with office prior to delivery Medicaid -Yes; Tricare - yes  [facility] [place]  Golden, DO; Friddle, NP; Wallace, MD; Wood, MD:  [address], [place], [place] [address] [phone] Mon- Fri 8:00-5:00, Sat 9:00-12:00 - sick visits only Accepting siblings of established patients and first time mom/baby Medicaid - Yes; Tricare - yes Patients must have vaccinations (baby vaccines)  [place] ([address] &  [address])   [facility] [place] [facility] [address] [place], [place] [address] [phone] Mon-Fri 8:00-5:00 Medicaid - no; Tricare - yes  [facility] Briscoe, MD; Schmidt, PA; Moreira, PA [address], [place], [place] [address] [phone] Mon-Fri 8:00-5:00 Medicaid- yes; Tricare - yes  [facility] [place] Boyd, MD; Jones, NP; Osborn, PA [address], [place], [place] [address] [phone] Mon-Fri 8:00-5:00 Medicaid - Yes; Tricare - yes  [place] ([address])  [facility] Atkinson, PA; Calderon, PA; Cummings, MD; Dillard, MD; Henrish, NP; Isenhour, DO; Martin, PA; Olson, MD; Ott, MD; Phillips, MD; Valente, PA; VanDeven, PA; Yonjof, NP [address], [place], [place] [address] [phone] Mon-Fri 8:30-5:00, Sat 9:00-12:00 - sick only Please register online [email or web address] then schedule online or call office Medicaid-Yes; Tricare -yes  [facility]  Dabrusco, MD; Dial, MD; Olney, MD; Fleenor, NP; Goolsby, PA; Tonuzi, MD; Turner, NP; West, MD [address], [place], [place] [address] [phone] Mon-Fri 8:00-5:30, Sat&Sun by appointment (phones open at 8:30) Medicaid - Yes; Tricare - yes  [place] ([address]) [facility] Allen, CPNP; Bates, MD; Gordon, MD; Mills, NP; Weinshilboum, DO [address], [place], [place] [address] [phone] M-F 8:00 - 5:15, Sat/Sun 9-12 sick visits only Medicaid - No; Tricare - yes  [facility] [place] [facility]  Brown, PA-C; Cowen, PA-C; Dennis, DO; Fuster, PA-C; Martin, PA-C; Shelton, PA-C; Spry, MD [address] [place], [place] [address] [phone] Mon-Thur 8:00-7:00, Fri 8:00-5:00 Accepting Medicaid for 13 and under only   [facility] [facility] [place] Hayes, FNP; List, FNP; Moran, MD; Pitonzo, PA-C; Scholer,   MD; Spangle, FNP; Nzenwa, FNP; Jasper, MD; Moran, MD [address] [place], [place] [address] [phone] Mon-Fri 8:30-5:30 Medicaid - Yes; Tricare - yes  [facility] [address]  Kelly, CPNP; Logan, MD; Poth, MD; Ramadoss, MD; Staton, NP [address], [place], [place] [address] [phone] Mon-Thur 8:00-5:30, Fri 8:00-5:00, Sat 9:00-12:00 Medicaid - yes, Tricare - yes  [place] ([address])  [facility] [place] Masneri, DO; Meyers, MD; Nelson, PA [address], [place], [place] [address] [phone] Mon-Fri 8:00-5:00, closed for lunch 12-1 Medicaid - No; Tricare - yes  [facility] [place] McGowen, MD [address], [place], [place] [address] [phone] Mon-Fri 8:00-5:00 Medicaid - No; Tricare - yes  [facility] [place] MacDonald, MD; Nayak, MD; Kearns, MD; Jones, MD [address] [place] [address], [place], [place] [address] [phone] Mon-Fri 8:00-5:00 Medicaid- Yes; Tricare - yes  [place] ([address])  [facility] [place] Martin, PA-C; Tabori, MD [address], [place], [place] [address] [phone] Mon-Fri 8:00-5:00 Medicaid - No; Tricare - yes  [facility] [place]  Margin - CPNP [address], [place], [place] [address] [phone] Mon-Weds 8:00-6:00, Thurs-Fri 8:00-5:00, Sat 9:00-12:00 Medicaid - yes; Tricare - yes   [facility] [place] Aubuchon, MD; Brandon, PA [address] [place], [place] [address] [phone] Mon-Fri 8:00-5:00 Medicaid - yes; Tricare - yes  [place] [facility] Providers  [facility] [address], [place], [place] [address] [phone] M, Thur: 8am -8pm, Tues, Weds: 8am - 5pm; Fri: 8-1 Medicaid - Yes; Tricare -  yes  [facility] Mertz, MD; Johnson, MD; Wells, MD; Downs, PA; Hockenberger, PA [address], [place], [place] [address] [phone] M-F 8:30 - 5:00 Medicaid - Call office; Tricare -yes  [facility] Bonney, MD; Page, MD, Minter, MD; Mueller, PNP; Thomason, NP [address], [place], [place] [address] [phone] M-F 8:30 - 5:00, Sat/Sun 8:30 - 12:30 (sick visits) Medicaid - Call office; Tricare -yes  [facility] Lewis, MD; Shaub, PNP; Boylston, MD; Quaile, PA; Nonato, NP; Landon, CPNP [address], [place], [place] [address] [phone] M-F 8:30 - 5:00 Medicaid - Call office; Tricare - yes  [facility] [place] Cline, MD; Dvergsten, MD; Flores, MD; Kawatu, MD; Nogo, MD [address], [place], [place] [address] [phone] M-Thur: 8:00 - 5:00; Fri: 8:00 - 4:00 Medicaid - yes; Tricare - yes  [facility] [address] [place], [place], [place] [address] [phone] M-F: 8:30- 5:00, closed for lunch 12:30 - 1:00 Medicaid - yes; Tricare -yes  [facility] [place] [address], [place], [place] [address] [phone] M-F 8:00 - 5:00 Medicaid - yes; Tricare - yes  [facility] Johnson, DO; Rumball, DO; Wicker, NP [address] [place], [place], [place] [address] [phone] M-F 8:00 - 5:00, Closed 12-1 for lunch Medicaid - Call; Tricare - yes  [facility] Stein, MD [address], [place], [place] [address] [phone] M-F: 8:00-5:00, Sat: 8:00 - noon Medicaid - call; Tricare -yes  [place] [facility] Providers  [facility] Collins, FNP-C [address], [place], [place] [address] [phone] M-W: 8:00-5:00, Thur: 8:00 - 7:00, Fri: 8:00 - noon Medicaid - yes; Tricare - yes  [facility] [place] Adams, FNP [address], [place],   [place] [address] [phone] M-F 8:00 - 5:00, Closed for lunch 12-1 Medicaid -  yes; Tricare - yes  [place] [facility] Providers  [facility] Smith, FNP, Melvin, MD, Fay, FNP-C [address] [place] [address] [place], [place], [place] [address] [phone] M-T 8:00-5:00, Wed-Fri 7:00-6:00 Medicaid - Yes; Tricare -yes  [facility] [place] Civiletti, DO; [address], [place], [place] [address] [phone] M-F 8:00 - 5:00, closed for lunch 12-1 Medicaid - Yes; Tricare - yes  [facility]  Barnes, MD; Bergdolt, MD; Caulfield, MD; Emrich, MD; Fiscus, MD; Hoppens, MD; Kylstra, MD, McPherson, MD; Todd, MD; Prestwood, MD; Waters, MD; Wood, MD [address], [place], [place] [address] [phone] M-F 8:00-5:00 Medicaid - yes; Tricare - yes  [facility] Cheema, MD (speaks Punjabi and Hindi) [address] [place], [place] [address] [phone] M-F: 8:30 - 5:00, closed 12:30 - 1 for lunch Medicaid - Yes; Tricare -yes  [place] [facility] Providers  [facility] Loda, MD; Timberlake, MD; Burke, MD [address], [place], [place] [address] [phone] M-Th: 8:00 - 5:30, Fri: 8:00 - 12:00 Medicaid - yes; Tricare - yes  [facility] [place] Lookabill, NP; Meier, MD; Daffron, MD [address], [place], [place] [address] [phone] M-F: 8:00 - 5:00 Medicaid - yes; Tricare - yes  [facility] Busse, NP; Bowman, NP; Baune, NP; Entwistle, MD; Williams, MD, Huffman, NP, Ferguson, MD; Patel, DO [address], [place], [place] [address] [phone] M-F: 8:30 - 5:30p Medicaid - yes; Tricare - yes Other locations available as well  [facility] [facility] Rajan, MD; Wilson, MD; Morgan, PA-C, Domenech, PA-C; Myers, PA-C [address], [place], [place] [address] [phone] M-W: 8:00am - 7:00pm, Thurs: 8:00am - 8:00pm; Fri: 8:00am -  5:00pm, closed daily from 12-1 for lunch Medicaid - yes; Tricare - yes  [place] [facility] Providers  [facility] [place], MD; Crystal, FNP; Hadley, MD; Stokes, MD; Johnson, PNP; Brady, PA-C; West, PNP; Gardner, MD;  [address], [place], [place] [address] [phone] M - Fri: 8am - 5pm, Sat 9-noon Medicaid - Yes; Tricare -yes  [facility] [place] Nayak, MD; Jones, FNP; McDonald, MD; Kearns, MD [address] [place] [address], [place], [place] [phone] M-F 8:00 - 5:00 Medicaid - call; Tricare - yes  [facility] [place] Bell, MD; Emory, PNP; Pinder, MD; Anderson, MD; Light, PA-C; Johnson, MD; Latta, MD; Saul, PNP; Rainey, MD; Clifford, MD; McClung, MD [address], [place], [place] [address] [phone] M-F 8:00am - 5:00pm; Sat. 9:00 - 11:00 Medicaid - yes; Tricare - yes  [facility] [place] Soldato-Couture, MD [address], [place], [place] [address] [phone] M-F 8:00 - 5:00 Medicaid - [place] Medicaid only; Tricare - yes  [facility] [place] Walker, MD; Davis, PNP; Ajizian, MD [address] [place] [address], [place], [place] [address] [phone] M-F 8:00 - 5:00 Medicaid - yes; Tricare - yes  [facility] [place] Barry, MD; Brown, MD, Forest, MD, Hazek, MD; Hoyle, MD; Smith, MD; [address], [place], [place] [address] [phone] M-F: 8-5 Medicaid - yes; Tricare - yes  [facility] [place] Brady, Md; Dowlen, MD; [address] [place] [address], [place], [place] [address] [phone] M-F 8-5 Medicaid - yes; Tricare - yes  [facility] Kearns, MD; Nayak, MD;   [place]old[place]-Courture, MD; Pellam-Palmer, DNP; Herring, PNP [address], [place], [place] [address] [phone] M-F 8-5 Medicaid - yes; Tricare - yes  [facility] Weathers, MD;  Merritt, PA-C; Davis-PA-C; Warnimont, MD [address], [place], [place] [address] [phone] M-F 7am - 5 pm Medicaid - call; Tricare - yes  [facility] Hill, PNP; Erickson, MD; Robinson, MD [address], [place], [place] [address] [phone] M-F 8-5 Medicaid - yes; Tricare - yes  [facility] Kribbs, MD; Warner, MD; Williams, FNP; Brooks, FNP; Boles, FNP; Romblad, PA-C; [place] - FNP [address], [place], [place] [address] [phone] M-F 8-5 Medicaid- yes; Tricare - yes  [facility] [place] [place] Yoder, MD; Verenes, MD; Armentrout, MD; [place], MD; Beasley, CPNP; Ford, MD; Erickson, MD; Rice, MD [address], [place], [place] [address] [phone] M-F: 8-5, Sat: 9-4, Sun 9-12 Medicaid - yes; Tricare - yes  [facility] Little, PNP; Davis, PNP [address], [place], [place] [address] [phone] M-F 8 - 5, closed 12-1 for lunch Medicaid - yes; Tricare - yes  [facility] Friesen, MD; Cnegia, MD; Rice, MD; Patel, DO [address], [place], [place] [address] [phone] M-F 8- 5:30 Medicaid - yes; Tricare - yes  [facility] [place] Zvolensky, MD; Ray, MD; Haas, MD [address], [place], [place] [address] [phone] M: 8-7; Tues-Fri: 8-5; Sat: 9-12 Medicaid - yes; Tricare - yes  [facility] [place] Heinrich, MD; Meyer, MD; Clark, MD; Rhyne, MD; Aubuchon, MD [address], [place], [place] [address] [phone] M: 8-7; Tues-Fri: 8-5; Sat: 8:30-12:30 Medicaid - yes; Tricare - yes  [facility] [place] Bista, MD; Dillard, PA [address], [place], [place] [address] [phone] Mon-Fri: 8-5 Medicaid - yes;  Tricare - yes  [facility] [place] Beasley, CPNP; Mahle, CPNP; Rice, MD; Duffy, MD; Culler, MD; [address], [place], [place] [address] [phone] M-F: 8-5, closed 1-2 for lunch Medicaid - yes; Tricare - yes  [facility] Rickman, PA; Mounce, NP; Smith, MD; Jordan, CPNP; Darty, PA; Ball, MD; Wallace, MD [address] [place] [address], [place], [place] [address] [phone] M-Thurs: 8-7; Fri: 8-6; Sat: 9-12; Sun 2-4 Medicaid - yes; Tricare - yes  [facility] Brown, MD; Shin, MD; [place], DNP, FNP; Sebesta, DO; [address], [place], [place] [address] [phone] M-F: 8-5 Medicaid - yes; Tricare - yes  [place] [facility] Providers  [facility] Dough, MD; [place], NP [address], [place], [place] [address] [phone] M - Fri: 8am - 5pm, closed for lunch 12-1 Medicaid - Yes; Tricare - yes  [facility] Manandhar, MD; Riley, MD; Sanger, DO; Vinocur, MD;Hall, PA; Walsh, PA; Campbell, NP [address], [place] [address] [phone] M-F 8:00 - 5:00, Sat 8:00 - 11:30 Medicaid - yes; Tricare - yes  [facility] Khan, MD; Redding, MD, Street, MD, Holt, MD, Burgart, MD; Rhyne, NP; Dickinson, PA;  [address], [place],  [address] [phone] M-F 8:10am - 5:00pm Medicaid - yes; Tricare - yes    [facility] Connors, MD; Kime, NP [address], [place], [place] [address] [phone] M-F 8:00 - 5:00 Medicaid - [place] Medicaid only; Tricare - yes  [facility] [place] Whyte, MD; Fox, NP [address], [place], [place] [address] [phone] M-F 8:00 - 5:00; Closed for lunch 12 - 1:00 Medicaid - yes;  Tricare - yes  [facility] Penner, MD; Wilburn, FNP [address], [place], [place] [address] [phone] Mon 9-5; Tues/Wed 10-5; Thurs 8:30-5; Fri: 8-12:30 Medicaid - yes; Tricare - yes  [place] [facility]  [facility]  Golding, MD; Jackson, PA-C [address], [place], [place] [address] [phone] phone [phone] fax M-F 7:15 - 4:30 Medicaid - yes; Tricare - yes  [facility] Gosrani, MD; Meccariello, DO [address] , [place] [address] [phone] M-Fri: 8:30 - 5:00, closed for lunch everyday noon - 1pm Medicaid - Yes; Tricare - yes  [facility] Burdine, MD; Daniel, MD; Howard, MD; Sasser, MD; Boles, PA; Boyd, PA-C; Carroll, PA; McGee, PA; Skillman, PA; Wilson, PA [address] [place], [place] [address] [phone] M-Thurs: 7:30am - 7:00pm; Friday 7:30am - 4pm; Sat: 8:00 - 1:00 Medicaid - Yes; Tricare - yes  [facility] [place] Akhbari, MD; Law, MD; Qayumi, MD; Salvador, DO [address] [place] [address], [place], [place] [address] [phone] M-Thur: 8:00 - 5:00, Fri: 8:00 - Noon Medicaid - yes; Tricare - yes No [place] [employer]  [facility] Dettinger, MD; Gottschalk, DO; Hawks, NP; Martin, NP; Morgan, NP; Milian, NP; Rakes, NP; Stacks, MD; Webster, PA [address], [place], [place] [address] [phone] M-F 8:00 - 5:00 Medicaid - yes; Tricare - yes  [facility] Collins, FNP-C; Bucio, FNP-C [address], [place], [place] [address] [phone] M, W, R 8:00-5:00, Tues: 8:00am - 7:00pm; Fri 8:00 - noon Medicaid - Yes; Tricare - yes  [facility] Khan, MD [address] [place] [address] [place], [place] [address] [phone]  M-Thurs 8:30-5:30, Fri: 8:30-12:30pm Medicaid - Yes; Tricare - N  

## 2022-10-29 NOTE — Progress Notes (Addendum)
   PRENATAL VISIT NOTE  Subjective:  Veronica Bradley is a 32 y.o. [id number] at [redacted]w[redacted]d being seen today for ongoing prenatal care.  She is currently monitored for the following issues for this high-risk pregnancy and has Other allergic rhinitis; Other atopic dermatitis; Supervision of other normal pregnancy, antepartum; Alpha thalassemia silent carrier; Carrier of spinal muscular atrophy; Sickle cell trait ([place]); Prior cesarean delivery; History of shoulder dystocia in prior pregnancy; and Gestational diabetes mellitus (GDM), antepartum on their problem list.  Patient reports vaginal irritation.  Contractions: Irritability. Vag. Bleeding: None.  Movement: Present. Denies leaking of fluid.   The following portions of the patient's history were reviewed and updated as appropriate: allergies, current medications, past family history, past medical history, past social history, past surgical history and problem list.   Objective:   Vitals:   10/29/22 1005 10/29/22 1013  BP:  111/76  Pulse:  (!) 114  Weight: 194 lb (88 kg) 194 lb (88 kg)    Fetal Status:     Movement: Present     General:  Alert, oriented and cooperative. Patient is in no acute distress.  Skin: Skin is warm and dry. No rash noted.   Cardiovascular: Normal heart rate noted  Respiratory: Normal respiratory effort, no problems with respiration noted  Abdomen: Soft, gravid, appropriate for gestational age.  Pain/Pressure: Present     Pelvic: Cervical exam performed in the presence of a chaperone        Extremities: Normal range of motion.     Mental Status: Normal mood and affect. Normal behavior. Normal judgment and thought content.   Assessment and Plan:  Pregnancy: [id number] at [redacted]w[redacted]d 1. Supervision of other normal pregnancy, antepartum Patient is doing well Third trimester labs today Vaginal swab collected Patient undecided on contraception- depo vs BTL. Will decide at next visit and sign form at that time  2. Diet controlled  gestational diabetes mellitus (GDM), antepartum CBGs reviewed and postprandial within range. Patient often eats apple in the middle of the night with "fasting" reading between 96-100. Advised patient to consume a protein rich snack at bedtime to avoid snacking in the middle of the night Follow up growth ultrasound end of March  3. Prior cesarean delivery Desires TOLAC- consent previously signed  4. History of shoulder dystocia in prior pregnancy   Preterm labor symptoms and general obstetric precautions including but not limited to vaginal bleeding, contractions, leaking of fluid and fetal movement were reviewed in detail with the patient. Please refer to After Visit Summary for other counseling recommendations.   Return in about 2 weeks (around 11/12/2022) for in person, ROB, High risk.  Future Appointments  Date Time Provider [place]  11/12/2022  9:55 AM Inez Catalina, MD [facility] None  11/14/2022  8:30 AM [facility] NURSE [facility]  11/14/2022  8:45 AM [facility] US5 [facility] [place]    Mora Bellman, MD

## 2022-10-29 NOTE — Addendum Note (Signed)
Addended by: Mora Bellman on: 10/29/2022 11:53 AM   Modules accepted: Orders

## 2022-10-29 NOTE — Progress Notes (Signed)
Pt states she has bumps she would liked checked and also complains of ?fluid leaking.

## 2022-10-30 LAB — CBC
Hematocrit: 37 % (ref 34.0–46.6)
Hemoglobin: 12.1 g/dL (ref 11.1–15.9)
MCH: 27.4 pg (ref 26.6–33.0)
MCHC: 32.7 g/dL (ref 31.5–35.7)
MCV: 84 fL (ref 79–97)
Platelets: 217 10*3/uL (ref 150–450)
RBC: 4.42 x10E6/uL (ref 3.77–5.28)
RDW: 13.2 % (ref 11.7–15.4)
WBC: 8.8 10*3/uL (ref 3.4–10.8)

## 2022-10-30 LAB — CERVICOVAGINAL ANCILLARY ONLY
Bacterial Vaginitis (gardnerella): NEGATIVE
Candida Glabrata: NEGATIVE
Candida Vaginitis: POSITIVE — AB
Comment: NEGATIVE
Comment: NEGATIVE
Comment: NEGATIVE

## 2022-10-30 LAB — RPR: RPR Ser Ql: NONREACTIVE

## 2022-10-30 LAB — HIV ANTIBODY (ROUTINE TESTING W REFLEX): HIV Screen 4th Generation wRfx: NONREACTIVE

## 2022-10-30 LAB — HEMOGLOBIN A1C
Est. average glucose Bld gHb Est-mCnc: 123 mg/dL
Hgb A1c MFr Bld: 5.9 % — ABNORMAL HIGH (ref 4.8–5.6)

## 2022-10-30 MED ORDER — TERCONAZOLE 0.8 % VA CREA: 1.0000 | TOPICAL_CREAM | Freq: Every day | VAGINAL | 0 refills | Status: DC

## 2022-10-30 NOTE — Addendum Note (Signed)
Addended by: Mora Bellman on: 10/30/2022 08:46 PM   Modules accepted: Orders

## 2022-11-01 ENCOUNTER — Telehealth: Payer: Self-pay | Admitting: *Deleted

## 2022-11-01 NOTE — Telephone Encounter (Signed)
TC from pt asking about water birth. Advised pt that there are rules and stipulations around appropriateness of water birth for a patient. Advised of need for discussion with provider at next visit to determine if she is a candidate. Advised pt of her high risk factors and possibility that she is not a candidate. Pt asking about taking placenta home to eat it. Advised of concerns regarding this practice. Advised of alternatives in the community for encapsulation of placenta vs consuming raw or cooked.

## 2022-11-12 ENCOUNTER — Ambulatory Visit (INDEPENDENT_AMBULATORY_CARE_PROVIDER_SITE_OTHER): Payer: Medicaid Other | Admitting: Obstetrics and Gynecology

## 2022-11-12 VITALS — BP 111/76 | HR 94 | Wt 194.0 lb

## 2022-11-12 DIAGNOSIS — Z148 Genetic carrier of other disease: Secondary | ICD-10-CM

## 2022-11-12 DIAGNOSIS — Z98891 History of uterine scar from previous surgery: Secondary | ICD-10-CM

## 2022-11-12 DIAGNOSIS — O24414 Gestational diabetes mellitus in pregnancy, insulin controlled: Secondary | ICD-10-CM

## 2022-11-12 DIAGNOSIS — D573 Sickle-cell trait: Secondary | ICD-10-CM

## 2022-11-12 DIAGNOSIS — Z3A31 31 weeks gestation of pregnancy: Secondary | ICD-10-CM

## 2022-11-12 DIAGNOSIS — Z8759 Personal history of other complications of pregnancy, childbirth and the puerperium: Secondary | ICD-10-CM

## 2022-11-12 DIAGNOSIS — D563 Thalassemia minor: Secondary | ICD-10-CM

## 2022-11-12 DIAGNOSIS — Z3480 Encounter for supervision of other normal pregnancy, unspecified trimester: Secondary | ICD-10-CM

## 2022-11-12 MED ORDER — LEVEMIR FLEXPEN 100 UNIT/ML ~~LOC~~ SOPN: 14.0000 [IU] | PEN_INJECTOR | Freq: Every day | SUBCUTANEOUS | 1 refills | Status: DC

## 2022-11-12 MED ORDER — INSULIN PEN NEEDLE 32G X 4 MM MISC: 1.0000 | Freq: Every day | 1 refills | Status: DC

## 2022-11-12 NOTE — Progress Notes (Signed)
PRENATAL VISIT NOTE  Subjective:  Veronica Bradley is a 32 y.o. [id number] at [redacted]w[redacted]d being seen today for ongoing prenatal care.  She is currently monitored for the following issues for this high-risk pregnancy and has Other allergic rhinitis; Other atopic dermatitis; Supervision of other normal pregnancy, antepartum; Alpha thalassemia silent carrier; Carrier of spinal muscular atrophy; Sickle cell trait ([place]); Prior cesarean delivery; History of shoulder dystocia in prior pregnancy; and Gestational diabetes mellitus (GDM), antepartum on their problem list.  Patient reports increased pelvic pain & pressure. Contractions: Irregular. Vag. Bleeding: None.  Movement: Present. Denies leaking of fluid.   The following portions of the patient's history were reviewed and updated as appropriate: allergies, current medications, past family history, past medical history, past social history, past surgical history and problem list.   Objective:   Vitals:   11/12/22 1005  BP: 111/76  Pulse: 94  Weight: 194 lb (88 kg)    Fetal Status: Fetal Heart Rate (bpm): 140   Movement: Present     General:  Alert, oriented and cooperative. Patient is in no acute distress.  Skin: Skin is warm and dry. No rash noted.   Cardiovascular: Normal heart rate noted  Respiratory: Normal respiratory effort, no problems with respiration noted  Abdomen: Soft, gravid, appropriate for gestational age.  Pain/Pressure: Present      Assessment and Plan:  Pregnancy: [id number] at [redacted]w[redacted]d 1. Supervision of other normal pregnancy, antepartum 2. [redacted] weeks gestation of pregnancy Pt is interested in natural childbirth and asked if she "had to" get an epidural. Discussed that an epidural is absolutely her choice, but given her plans to [facility] and history of shoulder dystocia it could significantly benefit her. Discussed that adequate analgesia can help facilitate the maneuvers we may need to do to deliver her baby if she has another shoulder  dystocia (see more below). Reviewed that epidural would also allow for faster surgical anesthesia and could potentially help patient avoid general anesthesia if repeat/emergent cesarean delivery is needed.  3. Insulin controlled gestational diabetes mellitus (GDM) during pregnancy, antepartum >50% of fasting values are above goal, often >100 - Will start insulin. Levemir 14u qHS ordered. - Amb Referral to Nutrition and Diabetic Education - Growth [place] & BPP scheduled 3/27  4. History of shoulder dystocia in prior pregnancy Reviewed concept of shoulder dystocia with patient. Explained risk of permanent neck/shoulder/arm injury that limits use of the injured arm/hand. Explained risk of neurologic injury or even neonatal death. Reviewed that these risks are rare, but certainly do happen and can be devastating. We cannot predict which babies will be affected, so we need to try to safely & expeditiously deliver the baby's body. In her prior pregnancy, her baby had decreased movement of the affected arm but it resolved spontaneously and her child has normal function of that arm.  5. Prior cesarean delivery Planning TOLAC, previously signed consents  6. Sickle cell trait ([facility]) 7. Carrier of spinal muscular atrophy 8. Alpha thalassemia silent carrier Awaiting partner testing  Return in about 2 weeks (around 11/26/2022) for ROB at 33 weeks.  Future Appointments  Date Time Provider [place]  11/14/2022  8:30 AM [facility] NURSE [facility]  11/14/2022  8:45 AM [facility] US5 [facility]  11/26/2022 11:15 AM Inez Catalina, MD [facility] None  12/10/2022  9:55 AM Constant, Vickii Chafe, MD [facility] None  12/24/2022  9:55 AM Inez Catalina, MD [facility] None  12/31/2022  9:55 AM Constant, Vickii Chafe, MD [facility] None  01/07/2023  9:55  AM Constant, Vickii Chafe, MD [facility] None   Inez Catalina, MD

## 2022-11-12 NOTE — Progress Notes (Signed)
Pt states she is having increase in pelvic pain/ possible ctx.  Pt states glucose levels are doing ok, fastings may be over 90.

## 2022-11-14 ENCOUNTER — Ambulatory Visit: Payer: Medicaid Other | Admitting: *Deleted

## 2022-11-14 ENCOUNTER — Other Ambulatory Visit: Payer: Self-pay | Admitting: *Deleted

## 2022-11-14 ENCOUNTER — Ambulatory Visit: Payer: Medicaid Other | Attending: Maternal & Fetal Medicine

## 2022-11-14 VITALS — BP 120/68 | HR 86

## 2022-11-14 DIAGNOSIS — O34219 Maternal care for unspecified type scar from previous cesarean delivery: Secondary | ICD-10-CM

## 2022-11-14 DIAGNOSIS — O99212 Obesity complicating pregnancy, second trimester: Secondary | ICD-10-CM | POA: Diagnosis not present

## 2022-11-14 DIAGNOSIS — Z362 Encounter for other antenatal screening follow-up: Secondary | ICD-10-CM | POA: Insufficient documentation

## 2022-11-14 DIAGNOSIS — O24414 Gestational diabetes mellitus in pregnancy, insulin controlled: Secondary | ICD-10-CM | POA: Diagnosis not present

## 2022-11-14 DIAGNOSIS — O99213 Obesity complicating pregnancy, third trimester: Secondary | ICD-10-CM

## 2022-11-14 DIAGNOSIS — Z3A31 31 weeks gestation of pregnancy: Secondary | ICD-10-CM

## 2022-11-14 DIAGNOSIS — O09299 Supervision of pregnancy with other poor reproductive or obstetric history, unspecified trimester: Secondary | ICD-10-CM | POA: Diagnosis present

## 2022-11-14 DIAGNOSIS — O24410 Gestational diabetes mellitus in pregnancy, diet controlled: Secondary | ICD-10-CM | POA: Diagnosis present

## 2022-11-14 DIAGNOSIS — Z3689 Encounter for other specified antenatal screening: Secondary | ICD-10-CM

## 2022-11-14 DIAGNOSIS — D563 Thalassemia minor: Secondary | ICD-10-CM

## 2022-11-14 DIAGNOSIS — D582 Other hemoglobinopathies: Secondary | ICD-10-CM | POA: Diagnosis not present

## 2022-11-14 DIAGNOSIS — O09293 Supervision of pregnancy with other poor reproductive or obstetric history, third trimester: Secondary | ICD-10-CM

## 2022-11-14 DIAGNOSIS — D573 Sickle-cell trait: Secondary | ICD-10-CM

## 2022-11-14 DIAGNOSIS — Z148 Genetic carrier of other disease: Secondary | ICD-10-CM

## 2022-11-14 DIAGNOSIS — E669 Obesity, unspecified: Secondary | ICD-10-CM

## 2022-11-15 ENCOUNTER — Telehealth: Payer: Self-pay

## 2022-11-15 MED ORDER — METFORMIN HCL 500 MG PO TABS: 500.0000 mg | ORAL_TABLET | Freq: Two times a day (BID) | ORAL | 5 refills | Status: DC

## 2022-11-15 NOTE — Telephone Encounter (Signed)
Returned call, pt states that she does not like using insulin for GDM and was advised that she can request to change to an oral medication for management, routed to provider for review.

## 2022-11-15 NOTE — Telephone Encounter (Signed)
Called pt and advised that Metformin was sent by provider

## 2022-11-15 NOTE — Addendum Note (Signed)
Addended by: Gale Journey on: 11/15/2022 03:00 PM   Modules accepted: Orders

## 2022-11-15 NOTE — Telephone Encounter (Signed)
Metformin 500mg  BID sent to her pharmacy.  Did she say why she did not like using insulin? Is she having lows or having trouble injecting herself?

## 2022-11-20 ENCOUNTER — Other Ambulatory Visit: Payer: Self-pay | Admitting: *Deleted

## 2022-11-20 ENCOUNTER — Ambulatory Visit: Payer: Medicaid Other | Admitting: Nutrition

## 2022-11-20 DIAGNOSIS — O99213 Obesity complicating pregnancy, third trimester: Secondary | ICD-10-CM

## 2022-11-20 DIAGNOSIS — O09299 Supervision of pregnancy with other poor reproductive or obstetric history, unspecified trimester: Secondary | ICD-10-CM

## 2022-11-20 DIAGNOSIS — O34219 Maternal care for unspecified type scar from previous cesarean delivery: Secondary | ICD-10-CM

## 2022-11-20 DIAGNOSIS — Z3689 Encounter for other specified antenatal screening: Secondary | ICD-10-CM

## 2022-11-20 DIAGNOSIS — O24410 Gestational diabetes mellitus in pregnancy, diet controlled: Secondary | ICD-10-CM

## 2022-11-20 DIAGNOSIS — Z862 Personal history of diseases of the blood and blood-forming organs and certain disorders involving the immune mechanism: Secondary | ICD-10-CM

## 2022-11-21 ENCOUNTER — Ambulatory Visit: Payer: Medicaid Other | Attending: Obstetrics and Gynecology | Admitting: *Deleted

## 2022-11-21 ENCOUNTER — Ambulatory Visit: Payer: Medicaid Other | Admitting: *Deleted

## 2022-11-21 VITALS — BP 112/73 | HR 87

## 2022-11-21 DIAGNOSIS — O24415 Gestational diabetes mellitus in pregnancy, controlled by oral hypoglycemic drugs: Secondary | ICD-10-CM | POA: Diagnosis present

## 2022-11-21 DIAGNOSIS — O09293 Supervision of pregnancy with other poor reproductive or obstetric history, third trimester: Secondary | ICD-10-CM | POA: Insufficient documentation

## 2022-11-21 DIAGNOSIS — O34219 Maternal care for unspecified type scar from previous cesarean delivery: Secondary | ICD-10-CM | POA: Diagnosis not present

## 2022-11-21 DIAGNOSIS — O09299 Supervision of pregnancy with other poor reproductive or obstetric history, unspecified trimester: Secondary | ICD-10-CM

## 2022-11-21 DIAGNOSIS — Z3A32 32 weeks gestation of pregnancy: Secondary | ICD-10-CM | POA: Diagnosis not present

## 2022-11-21 NOTE — Procedures (Signed)
Taejah Randt 1990/09/21 [redacted]w[redacted]d  Fetus A Non-Stress Test Interpretation for 11/21/22  Indication: Gestational Diabetes medication controlled  Fetal Heart Rate A Mode: External Baseline Rate (A): 135 bpm Variability: Moderate Accelerations: 15 x 15 Decelerations: None Multiple birth?: No  Uterine Activity Mode: Toco Contraction Frequency (min): none Resting Tone Palpated: Relaxed  Interpretation (Fetal Testing) Nonstress Test Interpretation: Reactive Overall Impression: Reassuring for gestational age Comments: tracing reviewed by Dr. Donalee Citrin

## 2022-11-22 ENCOUNTER — Encounter: Payer: Self-pay | Admitting: Obstetrics and Gynecology

## 2022-11-22 DIAGNOSIS — O3660X0 Maternal care for excessive fetal growth, unspecified trimester, not applicable or unspecified: Secondary | ICD-10-CM | POA: Insufficient documentation

## 2022-11-26 ENCOUNTER — Telehealth (INDEPENDENT_AMBULATORY_CARE_PROVIDER_SITE_OTHER): Payer: Medicaid Other | Admitting: Obstetrics and Gynecology

## 2022-11-26 ENCOUNTER — Telehealth: Payer: Medicaid Other | Admitting: Obstetrics and Gynecology

## 2022-11-26 DIAGNOSIS — O34219 Maternal care for unspecified type scar from previous cesarean delivery: Secondary | ICD-10-CM

## 2022-11-26 DIAGNOSIS — Z3480 Encounter for supervision of other normal pregnancy, unspecified trimester: Secondary | ICD-10-CM

## 2022-11-26 DIAGNOSIS — Z3A33 33 weeks gestation of pregnancy: Secondary | ICD-10-CM

## 2022-11-26 DIAGNOSIS — D573 Sickle-cell trait: Secondary | ICD-10-CM

## 2022-11-26 DIAGNOSIS — Z8759 Personal history of other complications of pregnancy, childbirth and the puerperium: Secondary | ICD-10-CM

## 2022-11-26 DIAGNOSIS — Z98891 History of uterine scar from previous surgery: Secondary | ICD-10-CM

## 2022-11-26 DIAGNOSIS — O3663X0 Maternal care for excessive fetal growth, third trimester, not applicable or unspecified: Secondary | ICD-10-CM

## 2022-11-26 DIAGNOSIS — O99013 Anemia complicating pregnancy, third trimester: Secondary | ICD-10-CM

## 2022-11-26 DIAGNOSIS — Z148 Genetic carrier of other disease: Secondary | ICD-10-CM

## 2022-11-26 DIAGNOSIS — O24415 Gestational diabetes mellitus in pregnancy, controlled by oral hypoglycemic drugs: Secondary | ICD-10-CM

## 2022-11-26 DIAGNOSIS — O99891 Other specified diseases and conditions complicating pregnancy: Secondary | ICD-10-CM

## 2022-11-26 DIAGNOSIS — O09293 Supervision of pregnancy with other poor reproductive or obstetric history, third trimester: Secondary | ICD-10-CM

## 2022-11-26 DIAGNOSIS — D563 Thalassemia minor: Secondary | ICD-10-CM

## 2022-11-26 NOTE — Progress Notes (Signed)
TELEHEALTH OBSTETRICS VISIT ENCOUNTER NOTE  Provider location: [facility] for [facility]   Patient location: Home  I connected with Veronica Bradley on 11/26/22 at  4:10 PM EDT by MyChart audiovisual encounter.    There are limitations, risks, security and privacy concerns of performing an evaluation and management service by telephone and the availability of in person appointments. Subjective:  Veronica Bradley is a 32 y.o. [id number] at [redacted]w[redacted]d being followed for ongoing prenatal care.  She is currently monitored for the following issues for this high-risk pregnancy and has Other allergic rhinitis; Other atopic dermatitis; Supervision of other normal pregnancy, antepartum; Alpha thalassemia silent carrier; Carrier of spinal muscular atrophy; Sickle cell trait; Prior cesarean delivery; History of shoulder dystocia in prior pregnancy; Gestational diabetes mellitus (GDM), antepartum; and LGA (large for gestational age) fetus affecting management of mother on their problem list.  Patient reports  doing okay overall . Reports fetal movement. Denies any contractions, bleeding or leaking of fluid.   The following portions of the patient's history were reviewed and updated as appropriate: allergies, current medications, past family history, past medical history, past social history, past surgical history and problem list.   Objective:  Last menstrual period 04/07/2022. General:  Alert, oriented and cooperative.   Mental Status: Normal mood and affect perceived. Normal judgment and thought content.  Rest of physical exam deferred due to type of encounter  Assessment and Plan:  Pregnancy: [id number] at [redacted]w[redacted]d 1. [redacted] weeks gestation of pregnancy 2. Supervision of other normal pregnancy, antepartum GBS/GC/CT next appt  3. Prior cesarean delivery Desires TOLAC, consent signed 1/29 Discussed that we will need to further address mode of delivery pending her blood sugar control and baby's growth  (currently LGA 95%ile)  4. Gestational diabetes mellitus (GDM) controlled on oral hypoglycemic drug, antepartum 5. Excessive fetal growth affecting management of pregnancy in third trimester, single or unspecified fetus 6. History of shoulder dystocia in prior pregnancy - Reviewed blood sugars. Fasting 84-98, postprandial 80-119. Reports fasting are usually < 95, but no log for review - Continue metformin 500mg  BID. We discussed that we may need to titrate up pending her sugars. Discussed that tight BG control will have a beneficial effect on her baby's growth and may lower risk of SD, CS, or neonatal hypoglycemia. - Growth Korea @ 31wk 2256g (95%), next growth scheduled 4/24 - Has weekly NST x 2 weeks, then weekly BPPs starting 4/24 Homero Fellers conversation today about timing & mode of delivery. Reviewed possibility of 37 week delivery if persistently LGA after starting medications. Also reviewed possibility of a stronger recommendation for cesarean delivery if projected to be larger than her first child or > 4500g  7. Sickle cell trait 8. Carrier of spinal muscular atrophy 9. Alpha thalassemia silent carrier Partner has not yet completed test kit  I discussed the assessment and treatment plan with the patient. The patient was provided an opportunity to ask questions and all were answered.   Please refer to After Visit Summary for other counseling recommendations.   I provided 17 minutes of non-face-to-face time during this encounter.  Future Appointments  Date Time Provider Department Center  11/28/2022 10:30 AM [facility] NURSE [facility]  11/28/2022 10:45 AM [facility] NST [facility]  12/05/2022 10:30 AM [facility] NURSE [facility]  12/05/2022 10:45 AM [facility] NST [facility]  12/10/2022  9:55 AM Constant, Peggy, MD [facility] None  12/12/2022  1:45 PM [facility] NURSE [facility]  12/12/2022  2:00 PM [facility] US1  [facility]  12/19/2022 10:45 AM [facility] NURSE [facility]  12/19/2022 11:00 AM [facility] US1  [facility]  12/24/2022  9:55 AM Lennart Pall, MD [facility] None  12/31/2022  9:55 AM Constant, Gigi Gin, MD [facility] None  01/07/2023  9:55 AM Constant, Gigi Gin, MD [facility] None   Lennart Pall, MD Center for [facility]

## 2022-11-28 ENCOUNTER — Ambulatory Visit: Payer: Medicaid Other | Admitting: *Deleted

## 2022-11-28 ENCOUNTER — Ambulatory Visit: Payer: Medicaid Other | Attending: Obstetrics and Gynecology | Admitting: *Deleted

## 2022-11-28 VITALS — BP 116/73 | HR 87

## 2022-11-28 DIAGNOSIS — O24414 Gestational diabetes mellitus in pregnancy, insulin controlled: Secondary | ICD-10-CM | POA: Insufficient documentation

## 2022-11-28 DIAGNOSIS — Z3A33 33 weeks gestation of pregnancy: Secondary | ICD-10-CM | POA: Diagnosis not present

## 2022-11-28 DIAGNOSIS — O09293 Supervision of pregnancy with other poor reproductive or obstetric history, third trimester: Secondary | ICD-10-CM

## 2022-11-28 DIAGNOSIS — O24419 Gestational diabetes mellitus in pregnancy, unspecified control: Secondary | ICD-10-CM

## 2022-11-28 DIAGNOSIS — O99213 Obesity complicating pregnancy, third trimester: Secondary | ICD-10-CM

## 2022-11-28 NOTE — Procedures (Addendum)
Veronica Bradley 1990/12/06 [redacted]w[redacted]d  Fetus A Non-Stress Test Interpretation for 11/28/22  Indication: Gestational Diabetes medication controlled  Fetal Heart Rate A Mode: External Baseline Rate (A): 135 bpm Variability: Moderate Accelerations: 15 x 15 Decelerations: None Multiple birth?: No  Uterine Activity Mode: Palpation, Toco Contraction Frequency (min): ui Resting Time: Adequate  Interpretation (Fetal Testing) Nonstress Test Interpretation: Reactive Overall Impression: Reassuring for gestational age Comments: Dr. Darra Lis reviewed tracing

## 2022-12-01 NOTE — Progress Notes (Signed)
Erroneous encounter. Unable to connect via mychart so appointment was rescheduled for later that day. Please see that encounter.

## 2022-12-05 ENCOUNTER — Ambulatory Visit: Payer: Medicaid Other | Admitting: *Deleted

## 2022-12-05 ENCOUNTER — Ambulatory Visit: Payer: Medicaid Other | Attending: Obstetrics and Gynecology | Admitting: *Deleted

## 2022-12-05 VITALS — BP 130/69 | HR 80

## 2022-12-05 DIAGNOSIS — O24415 Gestational diabetes mellitus in pregnancy, controlled by oral hypoglycemic drugs: Secondary | ICD-10-CM | POA: Diagnosis not present

## 2022-12-05 DIAGNOSIS — O24419 Gestational diabetes mellitus in pregnancy, unspecified control: Secondary | ICD-10-CM | POA: Diagnosis present

## 2022-12-05 DIAGNOSIS — O99213 Obesity complicating pregnancy, third trimester: Secondary | ICD-10-CM | POA: Diagnosis not present

## 2022-12-05 DIAGNOSIS — Z3A34 34 weeks gestation of pregnancy: Secondary | ICD-10-CM

## 2022-12-05 DIAGNOSIS — E669 Obesity, unspecified: Secondary | ICD-10-CM | POA: Insufficient documentation

## 2022-12-05 NOTE — Procedures (Signed)
Veronica Bradley September 09, 1990 [redacted]w[redacted]d  Fetus A Non-Stress Test Interpretation for 12/05/22  Indication: Gestational Diabetes medication controlled and obese  Fetal Heart Rate A Mode: External Baseline Rate (A): 145 bpm Variability: Moderate Accelerations: 15 x 15 Decelerations: None Multiple birth?: No  Uterine Activity Mode: Toco Contraction Frequency (min): irreg Contraction Duration (sec): 40 Contraction Quality: Mild Resting Tone Palpated: Relaxed Resting Time: Adequate  Interpretation (Fetal Testing) Nonstress Test Interpretation: Reactive Overall Impression: Reassuring for gestational age Comments: Tracing reviewed byDr. Judeth Cornfield

## 2022-12-07 ENCOUNTER — Encounter: Payer: Self-pay | Admitting: *Deleted

## 2022-12-10 ENCOUNTER — Ambulatory Visit (INDEPENDENT_AMBULATORY_CARE_PROVIDER_SITE_OTHER): Payer: Medicaid Other | Admitting: Obstetrics and Gynecology

## 2022-12-10 ENCOUNTER — Encounter: Payer: Self-pay | Admitting: Obstetrics and Gynecology

## 2022-12-10 VITALS — BP 137/74 | HR 90 | Wt 195.0 lb

## 2022-12-10 DIAGNOSIS — Z3480 Encounter for supervision of other normal pregnancy, unspecified trimester: Secondary | ICD-10-CM

## 2022-12-10 DIAGNOSIS — O3661X0 Maternal care for excessive fetal growth, first trimester, not applicable or unspecified: Secondary | ICD-10-CM

## 2022-12-10 DIAGNOSIS — Z98891 History of uterine scar from previous surgery: Secondary | ICD-10-CM

## 2022-12-10 DIAGNOSIS — O24410 Gestational diabetes mellitus in pregnancy, diet controlled: Secondary | ICD-10-CM

## 2022-12-10 NOTE — Progress Notes (Signed)
   PRENATAL VISIT NOTE  Subjective:  Veronica Bradley is a 32 y.o. [id number] at [redacted]w[redacted]d being seen today for ongoing prenatal care.  She is currently monitored for the following issues for this high-risk pregnancy and has Supervision of other normal pregnancy, antepartum; Alpha thalassemia silent carrier; Carrier of spinal muscular atrophy; Sickle cell trait; Prior cesarean delivery; History of shoulder dystocia in prior pregnancy; Gestational diabetes mellitus (GDM), antepartum; and LGA (large for gestational age) fetus affecting management of mother on their problem list.  Patient reports no complaints.  Contractions: Not present. Vag. Bleeding: None.  Movement: Present. Denies leaking of fluid.   The following portions of the patient's history were reviewed and updated as appropriate: allergies, current medications, past family history, past medical history, past social history, past surgical history and problem list.   Objective:   Vitals:   12/10/22 1004  BP: 137/74  Pulse: 90  Weight: 195 lb (88.5 kg)    Fetal Status: Fetal Heart Rate (bpm): 140 Fundal Height: 36 cm Movement: Present     General:  Alert, oriented and cooperative. Patient is in no acute distress.  Skin: Skin is warm and dry. No rash noted.   Cardiovascular: Normal heart rate noted  Respiratory: Normal respiratory effort, no problems with respiration noted  Abdomen: Soft, gravid, appropriate for gestational age.  Pain/Pressure: Present     Pelvic: Cervical exam deferred        Extremities: Normal range of motion.  Edema: Trace  Mental Status: Normal mood and affect. Normal behavior. Normal judgment and thought content.   Assessment and Plan:  Pregnancy: [id number] at [redacted]w[redacted]d 1. Supervision of other normal pregnancy, antepartum Patient is doing well without complaints Cultures next visit  2. Gestational diabetes mellitus (GDM), antepartum CBGs reviewed and great majority within range. COntinue metformin at current  dosing Encouraged patient to continue her daily mile walks and exercises  3. Excessive fetal growth affecting management of pregnancy in first trimester, single or unspecified fetus Follow up growth ultrasound on 4/24  4. Prior cesarean delivery Desires TOLAC Patient aware that timing of deliver and mode of delivery will depend on ultrasound findings  Preterm labor symptoms and general obstetric precautions including but not limited to vaginal bleeding, contractions, leaking of fluid and fetal movement were reviewed in detail with the patient. Please refer to After Visit Summary for other counseling recommendations.   Return in about 1 week (around 12/17/2022) for in person, ROB, High risk.  Future Appointments  Date Time Provider Department Center  12/12/2022  1:45 PM [facility] NURSE [facility]  12/12/2022  2:00 PM [facility] US1 [facility]  12/19/2022 10:45 AM [facility] NURSE [facility]  12/19/2022 11:00 AM [facility] US1 [facility]  12/24/2022  9:55 AM Lennart Pall, MD [facility] None  12/31/2022  9:55 AM Morelia Cassells, Gigi Gin, MD [facility] None  01/07/2023  9:55 AM Takiyah Bohnsack, Gigi Gin, MD [facility] None    Catalina Antigua, MD

## 2022-12-12 ENCOUNTER — Other Ambulatory Visit: Payer: Self-pay | Admitting: *Deleted

## 2022-12-12 ENCOUNTER — Ambulatory Visit: Payer: Medicaid Other | Attending: Obstetrics and Gynecology

## 2022-12-12 ENCOUNTER — Ambulatory Visit: Payer: Medicaid Other | Admitting: *Deleted

## 2022-12-12 VITALS — BP 116/74 | HR 122

## 2022-12-12 DIAGNOSIS — O24415 Gestational diabetes mellitus in pregnancy, controlled by oral hypoglycemic drugs: Secondary | ICD-10-CM | POA: Insufficient documentation

## 2022-12-12 DIAGNOSIS — O09299 Supervision of pregnancy with other poor reproductive or obstetric history, unspecified trimester: Secondary | ICD-10-CM

## 2022-12-12 DIAGNOSIS — D563 Thalassemia minor: Secondary | ICD-10-CM | POA: Diagnosis not present

## 2022-12-12 DIAGNOSIS — Z3689 Encounter for other specified antenatal screening: Secondary | ICD-10-CM | POA: Diagnosis present

## 2022-12-12 DIAGNOSIS — O09293 Supervision of pregnancy with other poor reproductive or obstetric history, third trimester: Secondary | ICD-10-CM

## 2022-12-12 DIAGNOSIS — O99019 Anemia complicating pregnancy, unspecified trimester: Secondary | ICD-10-CM | POA: Diagnosis present

## 2022-12-12 DIAGNOSIS — Z862 Personal history of diseases of the blood and blood-forming organs and certain disorders involving the immune mechanism: Secondary | ICD-10-CM | POA: Diagnosis present

## 2022-12-12 DIAGNOSIS — O285 Abnormal chromosomal and genetic finding on antenatal screening of mother: Secondary | ICD-10-CM | POA: Diagnosis not present

## 2022-12-12 DIAGNOSIS — E669 Obesity, unspecified: Secondary | ICD-10-CM

## 2022-12-12 DIAGNOSIS — D582 Other hemoglobinopathies: Secondary | ICD-10-CM

## 2022-12-12 DIAGNOSIS — O34219 Maternal care for unspecified type scar from previous cesarean delivery: Secondary | ICD-10-CM

## 2022-12-12 DIAGNOSIS — D573 Sickle-cell trait: Secondary | ICD-10-CM | POA: Diagnosis present

## 2022-12-12 DIAGNOSIS — O24410 Gestational diabetes mellitus in pregnancy, diet controlled: Secondary | ICD-10-CM | POA: Insufficient documentation

## 2022-12-12 DIAGNOSIS — Z3A35 35 weeks gestation of pregnancy: Secondary | ICD-10-CM

## 2022-12-12 DIAGNOSIS — O99213 Obesity complicating pregnancy, third trimester: Secondary | ICD-10-CM | POA: Diagnosis present

## 2022-12-13 ENCOUNTER — Telehealth: Payer: Self-pay

## 2022-12-13 ENCOUNTER — Inpatient Hospital Stay (HOSPITAL_COMMUNITY)
Admission: AD | Admit: 2022-12-13 | Discharge: 2022-12-13 | Disposition: A | Discharge status: Home / Self Care | Payer: Medicaid Other | Attending: Obstetrics and Gynecology | Admitting: Obstetrics and Gynecology

## 2022-12-13 ENCOUNTER — Encounter (HOSPITAL_COMMUNITY): Payer: Self-pay | Admitting: Obstetrics and Gynecology

## 2022-12-13 DIAGNOSIS — K625 Hemorrhage of anus and rectum: Secondary | ICD-10-CM | POA: Diagnosis not present

## 2022-12-13 DIAGNOSIS — O26893 Other specified pregnancy related conditions, third trimester: Secondary | ICD-10-CM | POA: Insufficient documentation

## 2022-12-13 DIAGNOSIS — K5900 Constipation, unspecified: Secondary | ICD-10-CM | POA: Diagnosis not present

## 2022-12-13 DIAGNOSIS — O99613 Diseases of the digestive system complicating pregnancy, third trimester: Secondary | ICD-10-CM | POA: Insufficient documentation

## 2022-12-13 DIAGNOSIS — O2243 Hemorrhoids in pregnancy, third trimester: Secondary | ICD-10-CM | POA: Insufficient documentation

## 2022-12-13 DIAGNOSIS — Z3A35 35 weeks gestation of pregnancy: Secondary | ICD-10-CM | POA: Insufficient documentation

## 2022-12-13 DIAGNOSIS — K649 Unspecified hemorrhoids: Secondary | ICD-10-CM

## 2022-12-13 HISTORY — DX: Depression, unspecified: F32.A

## 2022-12-13 HISTORY — DX: Gestational diabetes mellitus in pregnancy, unspecified control: O24.419

## 2022-12-13 LAB — URINALYSIS, ROUTINE W REFLEX MICROSCOPIC
Bilirubin Urine: NEGATIVE
Glucose, UA: NEGATIVE mg/dL
Hgb urine dipstick: NEGATIVE
Ketones, ur: 80 mg/dL — AB
Leukocytes,Ua: NEGATIVE
Nitrite: NEGATIVE
Protein, ur: NEGATIVE mg/dL
Specific Gravity, Urine: 1.009 (ref 1.005–1.030)
pH: 6 (ref 5.0–8.0)

## 2022-12-13 LAB — WET PREP, GENITAL
Sperm: NONE SEEN
Trich, Wet Prep: NONE SEEN
WBC, Wet Prep HPF POC: <10 (ref <10)
Yeast Wet Prep HPF POC: NONE SEEN

## 2022-12-13 MED ORDER — DOCUSATE SODIUM 250 MG PO CAPS: 250.0000 mg | ORAL_CAPSULE | Freq: Every day | ORAL | 2 refills | Status: DC

## 2022-12-13 NOTE — Telephone Encounter (Signed)
Pt left vm stating that she was having vaginal bleeding Returned call and pt stated that she is on her way to the hospital.

## 2022-12-13 NOTE — MAU Note (Signed)
First had 3 big spots of blood at 1630, since thien has noted bleeding when she wipes.  Started feeling cramping on the way here.  Has been having pelvic pain for a couple wks.

## 2022-12-13 NOTE — Discharge Instructions (Signed)

## 2022-12-13 NOTE — MAU Note (Addendum)
.  Veronica Bradley is a 32 y.o. at [redacted]w[redacted]d here in MAU reporting: took a shower  a few hours ago and had some bleeding when wiping. Called OB and told to come in. On the way to hospital she started having cramping. Good fetal movement reported.  Reports intercourse yesterday. LMP:  Onset of complaint: today Pain score: 7 Vitals:   12/13/22 1808  BP: 118/71  Pulse: 84  Resp: 18  Temp: 98 F (36.7 C)     FHT:153 Lab orders placed from triage:

## 2022-12-13 NOTE — MAU Provider Note (Signed)
History     CSN: [id number]  Arrival date and time: 12/13/22 1747   Event Date/Time   First Provider Initiated Contact with Patient 12/13/22 1835      Chief Complaint  Patient presents with   Vaginal Bleeding   Abdominal Pain   HPI  Veronica Bradley is a 32 y.o. [id number] at [redacted]w[redacted]d who presents for evaluation of bleeding. Patient reports she had a bowel movement at 1300 and saw bleeding when she wiped. She feels like it was vaginal bleeding but has not seen any more since then. She states she has been constipated and struggling with bowel movements. She denies any leaking of fluid. She reports occasional contractions. Patient rates the pain as a 2/10 and has not tried anything for the pain. She reports she has only eaten one meal today and hasn't had any water since she saw the bleeding.  She denies any discharge and leaking of fluid. Denies any constipation, diarrhea or any urinary complaints. Reports normal fetal movement.   OB History     Gravida  8   Para  2   Term  1   Preterm  1   AB  5   Living  2      SAB  0   IAB  5   Ectopic  0   Multiple  0   Live Births  2        Obstetric Comments  Second baby was 7#         Past Medical History:  Diagnosis Date   Depression    post partum   Eczema    Gestational diabetes    Other allergic rhinitis 07/26/2021   Other atopic dermatitis 07/26/2021   Prediabetes    Seasonal allergies     Past Surgical History:  Procedure Laterality Date   brazillian butt lift  2020   CESAREAN SECTION     DILATION AND CURETTAGE OF UTERUS     X5   LIPOSUCTION     removed from abd and back- infected into hips and butt   WISDOM TOOTH EXTRACTION      Family History  Problem Relation Age of Onset   Diabetes Mother        pre-diabetic   Allergic rhinitis Mother    Eczema Father    Other Father        "shot in the head"   Hypertension Maternal Aunt    Diabetes Maternal Grandmother    Allergic rhinitis Maternal  Grandmother    Kidney failure Maternal Grandmother    Diabetes Maternal Grandfather    Kidney failure Maternal Grandfather    Healthy Paternal Grandfather    Asthma Neg Hx    Cancer Neg Hx    Heart disease Neg Hx     Social History   Tobacco Use   Smoking status: Never   Smokeless tobacco: Never  Vaping Use   Vaping Use: Never used  Substance Use Topics   Alcohol use: Not Currently    Alcohol/week: 1.0 standard drink of alcohol    Types: 1 Glasses of wine per week   Drug use: Not Currently    Types: Marijuana    Comment: last use aug 2023    Allergies:  Allergies  Allergen Reactions   Shellfish Allergy Itching    Medications Prior to Admission  Medication Sig Dispense Refill Last Dose   aspirin 81 MG chewable tablet Chew 1 tablet (81 mg total) by mouth daily. 30 tablet 5 12/13/2022  metFORMIN (GLUCOPHAGE) 500 MG tablet Take 1 tablet (500 mg total) by mouth 2 (two) times daily with a meal. 60 tablet 5 12/13/2022 at 1200   Prenatal 28-0.8 MG TABS Take 1 tablet by mouth daily. 30 tablet 12 12/13/2022   Accu-Chek Softclix Lancets lancets 1 each by Other route 4 (four) times daily. 100 each 12    Blood Glucose Monitoring Suppl (ACCU-CHEK GUIDE) w/Device KIT 1 Device by Does not apply route 4 (four) times daily. 1 kit 0    Blood Pressure Monitoring (BLOOD PRESSURE KIT) DEVI 1 Device by Does not apply route once a week. 1 each 0    EPINEPHrine 0.3 mg/0.3 mL IJ SOAJ injection Inject 0.3 mg into the muscle as needed for anaphylaxis. (Patient not taking: Reported on 11/21/2022) 1 each 1    glucose blood (ACCU-CHEK GUIDE) test strip Use to check blood sugars four times a day was instructed 50 each 12     Review of Systems  Constitutional: Negative.  Negative for fatigue and fever.  HENT: Negative.    Respiratory: Negative.  Negative for shortness of breath.   Cardiovascular: Negative.  Negative for chest pain.  Gastrointestinal:  Positive for constipation. Negative for abdominal  pain, diarrhea, nausea and vomiting.  Genitourinary:  Positive for vaginal bleeding. Negative for dysuria and vaginal discharge.  Neurological: Negative.  Negative for dizziness and headaches.   Physical Exam   Blood pressure 118/71, pulse 84, temperature 98 F (36.7 C), resp. rate 18, last menstrual period 04/07/2022.  Patient Vitals for the past 24 hrs:  BP Temp Pulse Resp  12/13/22 1808 118/71 98 F (36.7 C) 84 18    Physical Exam Vitals and nursing note reviewed.  Constitutional:      General: She is not in acute distress.    Appearance: She is well-developed.  HENT:     Head: Normocephalic.  Eyes:     Pupils: Pupils are equal, round, and reactive to light.  Cardiovascular:     Rate and Rhythm: Normal rate and regular rhythm.     Heart sounds: Normal heart sounds.  Pulmonary:     Effort: Pulmonary effort is normal. No respiratory distress.     Breath sounds: Normal breath sounds.  Abdominal:     General: Bowel sounds are normal. There is no distension.     Palpations: Abdomen is soft.     Tenderness: There is no abdominal tenderness.  Genitourinary:    Rectum: External hemorrhoid present.     Comments: Pelvic exam: Cervix pink, visually closed, without lesion, scant white creamy discharge, vaginal walls and external genitalia normal Bimanual exam: Cervix 0/long/high, firm  Blood noted around rectum and in between gluteal folds Skin:    General: Skin is warm and dry.  Neurological:     Mental Status: She is alert and oriented to person, place, and time.  Psychiatric:        Mood and Affect: Mood normal.        Behavior: Behavior normal.        Thought Content: Thought content normal.        Judgment: Judgment normal.     Fetal Tracing:  Baseline: 130 Variability: moderate Accels: 15x15 Decels: none  Toco: occasional uc's with UI  Dilation: Closed Effacement (%): Thick Exam by:: Sharmon Revere   MAU Course  Procedures  Results for orders placed or  performed during the hospital encounter of 12/13/22 (from the past 24 hour(s))  Wet prep, genital  Status: Abnormal   Collection Time: 12/13/22  6:47 PM   Specimen: Vaginal  Result Value Ref Range   Yeast Wet Prep HPF POC NONE SEEN NONE SEEN   Trich, Wet Prep NONE SEEN NONE SEEN   Clue Cells Wet Prep HPF POC PRESENT (A) NONE SEEN   WBC, Wet Prep HPF POC <10 <10   Sperm NONE SEEN      MDM Labs ordered and reviewed.   UA Wet prep and gc/chlamydia PO hydration with improvement of UCs  Hemorrhoid and constipation precautions reviewed at length. OTC options for relief reviewed. Patient requested CNM send RX for stool softener.    Assessment and Plan   1. Rectal bleeding   2. Hemorrhoids, unspecified hemorrhoid type   3. [redacted] weeks gestation of pregnancy   4. Constipation during pregnancy in third trimester     -Discharge home in stable condition -Rx for colace sent to patient's pharmacy -Constipation precautions discussed -Patient advised to follow-up with OB as scheduled for prenatal care -Patient may return to MAU as needed or if her condition were to change or worsen  Rolm Bookbinder, CNM 12/13/2022, 6:35 PM

## 2022-12-14 LAB — GC/CHLAMYDIA PROBE AMP (~~LOC~~) NOT AT ARMC
Chlamydia: NEGATIVE
Comment: NEGATIVE
Comment: NORMAL
Neisseria Gonorrhea: NEGATIVE

## 2022-12-19 ENCOUNTER — Telehealth: Payer: Self-pay

## 2022-12-19 ENCOUNTER — Other Ambulatory Visit: Payer: Self-pay | Admitting: *Deleted

## 2022-12-19 ENCOUNTER — Ambulatory Visit: Payer: Medicaid Other | Admitting: *Deleted

## 2022-12-19 ENCOUNTER — Ambulatory Visit: Payer: Medicaid Other | Attending: Obstetrics and Gynecology

## 2022-12-19 VITALS — BP 122/70 | HR 83

## 2022-12-19 DIAGNOSIS — O285 Abnormal chromosomal and genetic finding on antenatal screening of mother: Secondary | ICD-10-CM | POA: Diagnosis not present

## 2022-12-19 DIAGNOSIS — O24410 Gestational diabetes mellitus in pregnancy, diet controlled: Secondary | ICD-10-CM | POA: Insufficient documentation

## 2022-12-19 DIAGNOSIS — D573 Sickle-cell trait: Secondary | ICD-10-CM | POA: Insufficient documentation

## 2022-12-19 DIAGNOSIS — Z3689 Encounter for other specified antenatal screening: Secondary | ICD-10-CM | POA: Insufficient documentation

## 2022-12-19 DIAGNOSIS — O99213 Obesity complicating pregnancy, third trimester: Secondary | ICD-10-CM | POA: Diagnosis present

## 2022-12-19 DIAGNOSIS — D563 Thalassemia minor: Secondary | ICD-10-CM

## 2022-12-19 DIAGNOSIS — Z148 Genetic carrier of other disease: Secondary | ICD-10-CM

## 2022-12-19 DIAGNOSIS — O34219 Maternal care for unspecified type scar from previous cesarean delivery: Secondary | ICD-10-CM | POA: Diagnosis present

## 2022-12-19 DIAGNOSIS — D582 Other hemoglobinopathies: Secondary | ICD-10-CM

## 2022-12-19 DIAGNOSIS — O4100X0 Oligohydramnios, unspecified trimester, not applicable or unspecified: Secondary | ICD-10-CM

## 2022-12-19 DIAGNOSIS — Z3A36 36 weeks gestation of pregnancy: Secondary | ICD-10-CM

## 2022-12-19 DIAGNOSIS — O24415 Gestational diabetes mellitus in pregnancy, controlled by oral hypoglycemic drugs: Secondary | ICD-10-CM | POA: Insufficient documentation

## 2022-12-19 DIAGNOSIS — O99019 Anemia complicating pregnancy, unspecified trimester: Secondary | ICD-10-CM | POA: Insufficient documentation

## 2022-12-19 DIAGNOSIS — O09293 Supervision of pregnancy with other poor reproductive or obstetric history, third trimester: Secondary | ICD-10-CM

## 2022-12-19 DIAGNOSIS — O09299 Supervision of pregnancy with other poor reproductive or obstetric history, unspecified trimester: Secondary | ICD-10-CM | POA: Insufficient documentation

## 2022-12-19 DIAGNOSIS — E669 Obesity, unspecified: Secondary | ICD-10-CM

## 2022-12-20 ENCOUNTER — Encounter (HOSPITAL_COMMUNITY): Payer: Self-pay | Admitting: Obstetrics and Gynecology

## 2022-12-20 ENCOUNTER — Inpatient Hospital Stay (HOSPITAL_COMMUNITY)
Admission: AD | Admit: 2022-12-20 | Discharge: 2022-12-21 | Disposition: A | Discharge status: Home / Self Care | Payer: Medicaid Other | Attending: Obstetrics and Gynecology | Admitting: Obstetrics and Gynecology

## 2022-12-20 DIAGNOSIS — Z3A36 36 weeks gestation of pregnancy: Secondary | ICD-10-CM | POA: Insufficient documentation

## 2022-12-20 DIAGNOSIS — O99891 Other specified diseases and conditions complicating pregnancy: Secondary | ICD-10-CM | POA: Insufficient documentation

## 2022-12-20 DIAGNOSIS — O26893 Other specified pregnancy related conditions, third trimester: Secondary | ICD-10-CM | POA: Diagnosis not present

## 2022-12-20 DIAGNOSIS — O368130 Decreased fetal movements, third trimester, not applicable or unspecified: Secondary | ICD-10-CM | POA: Diagnosis not present

## 2022-12-20 DIAGNOSIS — Z3689 Encounter for other specified antenatal screening: Secondary | ICD-10-CM | POA: Diagnosis not present

## 2022-12-20 DIAGNOSIS — N898 Other specified noninflammatory disorders of vagina: Secondary | ICD-10-CM

## 2022-12-20 NOTE — MAU Provider Note (Signed)
History     [id number]  Arrival date and time: 12/20/22 2225    Chief Complaint  Patient presents with   Contractions   Decreased Fetal Movement     HPI Veronica Bradley is a 32 y.o. [id number] at [redacted]w[redacted]d by LMP who presents via ems for decreased fetal movement.  Reports 2 hours of decreased movement prior to presenting to MAU. Upon arrival started feeling good movement again. Also reports 2 week history of increased discharge & feeling like her underwear is wet. Currently denies leaking of fluid. States she has been worried after being told her fluid was low by MFM yesterday.  Reports contractions every 10-20 minutes. Denies vaginal bleeding.    B/Positive/-- (11/27 1437)  OB History     Gravida  8   Para  2   Term  1   Preterm  1   AB  5   Living  2      SAB  0   IAB  5   Ectopic  0   Multiple  0   Live Births  2        Obstetric Comments  Second baby was 7#         Past Medical History:  Diagnosis Date   Depression    post partum   Eczema    Gestational diabetes    Other allergic rhinitis 07/26/2021   Other atopic dermatitis 07/26/2021   Prediabetes    Seasonal allergies     Past Surgical History:  Procedure Laterality Date   brazillian butt lift  2020   CESAREAN SECTION     DILATION AND CURETTAGE OF UTERUS     X5   LIPOSUCTION     removed from abd and back- infected into hips and butt   WISDOM TOOTH EXTRACTION      Family History  Problem Relation Age of Onset   Diabetes Mother        pre-diabetic   Allergic rhinitis Mother    Eczema Father    Other Father        "shot in the head"   Hypertension Maternal Aunt    Diabetes Maternal Grandmother    Allergic rhinitis Maternal Grandmother    Kidney failure Maternal Grandmother    Diabetes Maternal Grandfather    Kidney failure Maternal Grandfather    Healthy Paternal Grandfather    Asthma Neg Hx    Cancer Neg Hx    Heart disease Neg Hx     Social History   Socioeconomic  History   Marital status: Married    Spouse name: Gardiner Barefoot   Number of children: Not on file   Years of education: Not on file   Highest education level: Not on file  Occupational History   Not on file  Tobacco Use   Smoking status: Never   Smokeless tobacco: Never  Vaping Use   Vaping Use: Never used  Substance and Sexual Activity   Alcohol use: Not Currently    Alcohol/week: 1.0 standard drink of alcohol    Types: 1 Glasses of wine per week   Drug use: Not Currently    Types: Marijuana    Comment: last use aug 2023   Sexual activity: Yes    Birth control/protection: None  Other Topics Concern   Not on file  Social History Narrative   Not on file   Social Determinants of Health   Financial Resource Strain: Not on file  Food Insecurity: No Food Insecurity (09/13/2022)  Hunger Vital Sign    Worried About Programme researcher, broadcasting/film/video in the Last Year: Never true    Ran Out of Food in the Last Year: Never true  Transportation Needs: Not on file  Physical Activity: Not on file  Stress: Not on file  Social Connections: Not on file  Intimate Partner Violence: Not on file    Allergies  Allergen Reactions   Shellfish Allergy Itching    No current facility-administered medications on file prior to encounter.   Current Outpatient Medications on File Prior to Encounter  Medication Sig Dispense Refill   aspirin 81 MG chewable tablet Chew 1 tablet (81 mg total) by mouth daily. 30 tablet 5   docusate sodium (COLACE) 250 MG capsule Take 1 capsule (250 mg total) by mouth daily. 30 capsule 2   metFORMIN (GLUCOPHAGE) 500 MG tablet Take 1 tablet (500 mg total) by mouth 2 (two) times daily with a meal. 60 tablet 5   Prenatal 28-0.8 MG TABS Take 1 tablet by mouth daily. 30 tablet 12   Accu-Chek Softclix Lancets lancets 1 each by Other route 4 (four) times daily. 100 each 12   Blood Glucose Monitoring Suppl (ACCU-CHEK GUIDE) w/Device KIT 1 Device by Does not apply route 4 (four) times daily.  1 kit 0   Blood Pressure Monitoring (BLOOD PRESSURE KIT) DEVI 1 Device by Does not apply route once a week. 1 each 0   EPINEPHrine 0.3 mg/0.3 mL IJ SOAJ injection Inject 0.3 mg into the muscle as needed for anaphylaxis. (Patient not taking: Reported on 11/21/2022) 1 each 1   glucose blood (ACCU-CHEK GUIDE) test strip Use to check blood sugars four times a day was instructed 50 each 12     ROS Pertinent positives and negative per HPI, all others reviewed and negative  Physical Exam   BP 118/76 (BP Location: Right Arm)   Pulse 82   Temp 98.2 F (36.8 C) (Oral)   Resp 17   LMP 04/07/2022 (Approximate)   SpO2 100%   Patient Vitals for the past 24 hrs:  BP Temp Temp src Pulse Resp SpO2  12/20/22 2234 118/76 98.2 F (36.8 C) Oral 82 17 100 %    Physical Exam Vitals and nursing note reviewed.  Constitutional:      General: She is not in acute distress.    Appearance: Normal appearance. She is not ill-appearing.  HENT:     Head: Normocephalic and atraumatic.  Eyes:     General: No scleral icterus.    Pupils: Pupils are equal, round, and reactive to light.  Pulmonary:     Effort: Pulmonary effort is normal. No respiratory distress.  Abdominal:     Tenderness: There is no abdominal tenderness.     Comments: gravid  Skin:    General: Skin is warm and dry.  Neurological:     Mental Status: She is alert.      Cervical Exam Dilation: Closed Exam by:: Alondra Twitty RN   FHT Baseline 135, moderate variability, 15x15 accels, no decels Toco: Q 2-5 mins Cat: 1  Labs Results for orders placed or performed during the hospital encounter of 12/20/22 (from the past 24 hour(s))  Amnisure rupture of membrane (rom)not at [facility]     Status: None   Collection Time: 12/20/22 11:49 PM  Result Value Ref Range   Amnisure ROM NEGATIVE     Imaging No results found.  MAU Course  Procedures Lab Orders  Amnisure rupture of membrane (rom)not at [facility]    No orders of the defined  types were placed in this encounter.  Imaging Orders  No imaging studies ordered today    MDM Low   Assessment and Plan   1. Decreased fetal movements in third trimester, single or unspecified fetus   2. NST (non-stress test) reactive   3. Vaginal discharge during pregnancy in third trimester   4. [redacted] weeks gestation of pregnancy    -Patient reports normal movement in MAU & documented over 20 movements during her NST. NST reactive.  -Reviewed imaging from MFM> Ultrasound yesterday & last week showed AFI of 6. Has f/u scan tomorrow, if AFI below 5 will recommend delivery. Amnisure today is negative. Patient instructed to keep MFM appointment tomorrow.   #FWB: Cat 1 tracing    Dispo: discharged to home in stable condition.   Discharge Instructions     Discharge patient   Complete by: As directed    Discharge disposition: 01-Home or Self Care   Discharge patient date: 12/21/2022       Judeth Horn, NP 12/21/22 2:47 AM  Allergies as of 12/21/2022       Reactions   Shellfish Allergy Itching        Medication List     TAKE these medications    Accu-Chek Guide test strip Generic drug: glucose blood Use to check blood sugars four times a day was instructed   Accu-Chek Guide w/Device Kit 1 Device by Does not apply route 4 (four) times daily.   Accu-Chek Softclix Lancets lancets 1 each by Other route 4 (four) times daily.   aspirin 81 MG chewable tablet Chew 1 tablet (81 mg total) by mouth daily.   Blood Pressure Kit Devi 1 Device by Does not apply route once a week.   docusate sodium 250 MG capsule Commonly known as: COLACE Take 1 capsule (250 mg total) by mouth daily.   EPINEPHrine 0.3 mg/0.3 mL Soaj injection Commonly known as: EPI-PEN Inject 0.3 mg into the muscle as needed for anaphylaxis.   metFORMIN 500 MG tablet Commonly known as: GLUCOPHAGE Take 1 tablet (500 mg total) by mouth 2 (two) times daily with a meal.   Prenatal 28-0.8 MG  Tabs Take 1 tablet by mouth daily.

## 2022-12-20 NOTE — MAU Note (Signed)
..  Veronica Bradley is a 32 y.o. at [redacted]w[redacted]d here in MAU via EMS reporting: decreased fetal movement per EMS, but pt reports that she felt baby move upon arrival to unit, clicker given and instructed to click when fetal movement is felt.  Reports contractions every 10-20 minutes.   Pain score: 6/10 Vitals:   12/20/22 2234  BP: 118/76  Pulse: 82  Resp: 17  Temp: 98.2 F (36.8 C)  SpO2: 100%     [id number] in room

## 2022-12-21 ENCOUNTER — Ambulatory Visit: Payer: Medicaid Other | Attending: Obstetrics

## 2022-12-21 ENCOUNTER — Ambulatory Visit: Payer: Medicaid Other | Admitting: *Deleted

## 2022-12-21 ENCOUNTER — Other Ambulatory Visit: Payer: Self-pay | Admitting: Obstetrics

## 2022-12-21 VITALS — BP 138/80 | HR 83

## 2022-12-21 DIAGNOSIS — D582 Other hemoglobinopathies: Secondary | ICD-10-CM

## 2022-12-21 DIAGNOSIS — O34219 Maternal care for unspecified type scar from previous cesarean delivery: Secondary | ICD-10-CM | POA: Diagnosis not present

## 2022-12-21 DIAGNOSIS — Z148 Genetic carrier of other disease: Secondary | ICD-10-CM

## 2022-12-21 DIAGNOSIS — O99213 Obesity complicating pregnancy, third trimester: Secondary | ICD-10-CM

## 2022-12-21 DIAGNOSIS — O26893 Other specified pregnancy related conditions, third trimester: Secondary | ICD-10-CM

## 2022-12-21 DIAGNOSIS — E669 Obesity, unspecified: Secondary | ICD-10-CM

## 2022-12-21 DIAGNOSIS — O4100X0 Oligohydramnios, unspecified trimester, not applicable or unspecified: Secondary | ICD-10-CM

## 2022-12-21 DIAGNOSIS — O24415 Gestational diabetes mellitus in pregnancy, controlled by oral hypoglycemic drugs: Secondary | ICD-10-CM | POA: Insufficient documentation

## 2022-12-21 DIAGNOSIS — N898 Other specified noninflammatory disorders of vagina: Secondary | ICD-10-CM

## 2022-12-21 DIAGNOSIS — Z3A36 36 weeks gestation of pregnancy: Secondary | ICD-10-CM

## 2022-12-21 DIAGNOSIS — O368130 Decreased fetal movements, third trimester, not applicable or unspecified: Secondary | ICD-10-CM

## 2022-12-21 DIAGNOSIS — O285 Abnormal chromosomal and genetic finding on antenatal screening of mother: Secondary | ICD-10-CM | POA: Diagnosis not present

## 2022-12-21 DIAGNOSIS — O09293 Supervision of pregnancy with other poor reproductive or obstetric history, third trimester: Secondary | ICD-10-CM

## 2022-12-21 DIAGNOSIS — Z3689 Encounter for other specified antenatal screening: Secondary | ICD-10-CM

## 2022-12-21 DIAGNOSIS — D563 Thalassemia minor: Secondary | ICD-10-CM

## 2022-12-21 LAB — AMNISURE RUPTURE OF MEMBRANE (ROM) NOT AT ARMC: Amnisure ROM: NEGATIVE

## 2022-12-24 ENCOUNTER — Ambulatory Visit (INDEPENDENT_AMBULATORY_CARE_PROVIDER_SITE_OTHER): Payer: Medicaid Other | Admitting: Obstetrics and Gynecology

## 2022-12-24 ENCOUNTER — Other Ambulatory Visit (HOSPITAL_COMMUNITY)
Admission: RE | Admit: 2022-12-24 | Discharge: 2022-12-24 | Disposition: A | Discharge status: Home / Self Care | Payer: Medicaid Other | Source: Ambulatory Visit | Attending: Obstetrics and Gynecology | Admitting: Obstetrics and Gynecology

## 2022-12-24 ENCOUNTER — Encounter: Payer: Self-pay | Admitting: Obstetrics and Gynecology

## 2022-12-24 VITALS — BP 113/71 | HR 103 | Wt 190.7 lb

## 2022-12-24 DIAGNOSIS — Z148 Genetic carrier of other disease: Secondary | ICD-10-CM

## 2022-12-24 DIAGNOSIS — Z3A37 37 weeks gestation of pregnancy: Secondary | ICD-10-CM

## 2022-12-24 DIAGNOSIS — D563 Thalassemia minor: Secondary | ICD-10-CM

## 2022-12-24 DIAGNOSIS — Z0371 Encounter for suspected problem with amniotic cavity and membrane ruled out: Secondary | ICD-10-CM | POA: Diagnosis not present

## 2022-12-24 DIAGNOSIS — Z1339 Encounter for screening examination for other mental health and behavioral disorders: Secondary | ICD-10-CM

## 2022-12-24 DIAGNOSIS — Z8759 Personal history of other complications of pregnancy, childbirth and the puerperium: Secondary | ICD-10-CM

## 2022-12-24 DIAGNOSIS — Z3480 Encounter for supervision of other normal pregnancy, unspecified trimester: Secondary | ICD-10-CM | POA: Insufficient documentation

## 2022-12-24 DIAGNOSIS — O24415 Gestational diabetes mellitus in pregnancy, controlled by oral hypoglycemic drugs: Secondary | ICD-10-CM

## 2022-12-24 DIAGNOSIS — Z98891 History of uterine scar from previous surgery: Secondary | ICD-10-CM

## 2022-12-24 DIAGNOSIS — D573 Sickle-cell trait: Secondary | ICD-10-CM

## 2022-12-24 LAB — POCT NITRAZINE TEST: POCT Nitrazine (amniosure): NEGATIVE

## 2022-12-24 NOTE — Patient Instructions (Signed)
INCREASE your night time medicine to 1000mg  (2 pills) You will take 1 pill in the morning and 2 pills at night.  I will send you a MyChart message with couples/marital counseling information.   You will get a call with a date for your induction in the next few days.

## 2022-12-24 NOTE — Progress Notes (Signed)
Pt presents for ROB reports watery discharge last night while working out.  CBG readings available

## 2022-12-24 NOTE — Progress Notes (Signed)
   PRENATAL VISIT NOTE  Subjective:  Veronica Bradley is a 32 y.o. [id number] at [redacted]w[redacted]d being seen today for ongoing prenatal care.  She is currently monitored for the following issues for this high-risk pregnancy and has Supervision of other normal pregnancy, antepartum; Alpha thalassemia silent carrier; Carrier of spinal muscular atrophy; Sickle cell trait (HCC); Prior cesarean delivery; History of shoulder dystocia in prior pregnancy; and Gestational diabetes mellitus (GDM), antepartum on their problem list.  Patient reports  doing ok overall. Interpersonal issues with her partner - he is depressed and having trouble being supportive. She is also stressed about abnormal fluid .  Contractions: Irritability. Vag. Bleeding: None.  Movement: Present. Denies leaking of fluid.   The following portions of the patient's history were reviewed and updated as appropriate: allergies, current medications, past family history, past medical history, past social history, past surgical history and problem list.   Objective:   Vitals:   12/24/22 1003  BP: 113/71  Pulse: (!) 103  Weight: 190 lb 11.2 oz (86.5 kg)   Fetal Status: Fetal Heart Rate (bpm): 154   Movement: Present     General:  Alert, oriented and cooperative. Patient is in no acute distress.  Skin: Skin is warm and dry. No rash noted.   Cardiovascular: Normal heart rate noted  Respiratory: Normal respiratory effort, no problems with respiration noted  Abdomen: Soft, gravid, appropriate for gestational age.  Pain/Pressure: Present     Pelvic: Performed in presence of chaperone. Small pool, negative nitrazine. Cervix visually closed. Pt reports intercourse in the past 24-48h.  Assessment and Plan:  Pregnancy: [id number] at [redacted]w[redacted]d 1. Supervision of other normal pregnancy, antepartum 2. [redacted] weeks gestation of pregnancy Cultures collected Being followed w/ weekly BPPs for GDM & low normal AFI (5/3 AFI 6.91). No e/o ROM on exam today.  3. Gestational  diabetes mellitus (GDM) controlled on oral hypoglycemic drug, antepartum BG reviewed. >50% of fasting are elevated, but postprandial well controlled. Post dinner in 80s. Will increase to MTF 500/1000 Weekly BPPs, low normal AFI as above Last growth @ 35/4: 2935g (73%), AC 94%, post, cephalic, 6.79 IOL for 39 weeks ordered  4. Prior cesarean delivery 5. History of shoulder dystocia in prior pregnancy For TOLAC. Reviewed risks of recurrent SD with possibility of neonatal injury as well as risks of TOLAC. She is accepting of these risks. Strongly desires TOLAC due to difficulties with recovery with her CS.  IOL for 39 weeks ordered  6. Sickle cell trait (HCC) 7. Carrier of spinal muscular atrophy 8. Alpha thalassemia silent carrier Partner has not completed test kit, unlikely to complete per pt  Term labor symptoms and general obstetric precautions including but not limited to vaginal bleeding, contractions, leaking of fluid and fetal movement were reviewed in detail with the patient  Please refer to After Visit Summary for other counseling recommendations.   Return in about 1 week (around 12/31/2022) for return OB at 38 weeks.  Future Appointments  Date Time Provider Department Center  12/26/2022  7:30 AM [facility] NURSE [facility]  12/26/2022  7:45 AM [facility] US7 [facility]  12/31/2022  9:55 AM Constant, Gigi Gin, MD [facility] None  01/02/2023  9:30 AM [facility] NURSE [facility]  01/02/2023  9:45 AM [facility] US6 [facility]  01/07/2023  9:55 AM Constant, Gigi Gin, MD [facility] None   Lennart Pall, MD

## 2022-12-25 LAB — CERVICOVAGINAL ANCILLARY ONLY
Bacterial Vaginitis (gardnerella): NEGATIVE
Candida Glabrata: NEGATIVE
Candida Vaginitis: POSITIVE — AB
Chlamydia: NEGATIVE
Comment: NEGATIVE
Comment: NEGATIVE
Comment: NEGATIVE
Comment: NEGATIVE
Comment: NEGATIVE
Comment: NORMAL
Neisseria Gonorrhea: NEGATIVE
Trichomonas: NEGATIVE

## 2022-12-25 MED ORDER — CLOTRIMAZOLE 1 % VA CREA: 1.0000 | TOPICAL_CREAM | Freq: Every day | VAGINAL | 0 refills | Status: DC

## 2022-12-25 NOTE — Addendum Note (Signed)
Addended by: Harvie Bridge on: 12/25/2022 02:05 PM   Modules accepted: Orders

## 2022-12-26 ENCOUNTER — Ambulatory Visit (HOSPITAL_BASED_OUTPATIENT_CLINIC_OR_DEPARTMENT_OTHER): Payer: Medicaid Other | Admitting: *Deleted

## 2022-12-26 ENCOUNTER — Ambulatory Visit (HOSPITAL_BASED_OUTPATIENT_CLINIC_OR_DEPARTMENT_OTHER): Payer: Medicaid Other | Admitting: Obstetrics

## 2022-12-26 ENCOUNTER — Ambulatory Visit: Payer: Medicaid Other | Admitting: *Deleted

## 2022-12-26 ENCOUNTER — Encounter (HOSPITAL_COMMUNITY): Payer: Self-pay | Admitting: Obstetrics and Gynecology

## 2022-12-26 ENCOUNTER — Inpatient Hospital Stay (HOSPITAL_COMMUNITY)
Admission: AD | Admit: 2022-12-26 | Discharge: 2022-12-28 | DRG: 806 | Disposition: A | Discharge status: Home / Self Care | Payer: Medicaid Other | Attending: Family Medicine | Admitting: Family Medicine

## 2022-12-26 ENCOUNTER — Other Ambulatory Visit: Payer: Self-pay

## 2022-12-26 ENCOUNTER — Ambulatory Visit (HOSPITAL_BASED_OUTPATIENT_CLINIC_OR_DEPARTMENT_OTHER): Payer: Medicaid Other

## 2022-12-26 ENCOUNTER — Other Ambulatory Visit: Payer: Self-pay | Admitting: Obstetrics and Gynecology

## 2022-12-26 VITALS — BP 153/78 | HR 92

## 2022-12-26 DIAGNOSIS — D573 Sickle-cell trait: Secondary | ICD-10-CM

## 2022-12-26 DIAGNOSIS — O99213 Obesity complicating pregnancy, third trimester: Secondary | ICD-10-CM | POA: Insufficient documentation

## 2022-12-26 DIAGNOSIS — Z3A37 37 weeks gestation of pregnancy: Secondary | ICD-10-CM

## 2022-12-26 DIAGNOSIS — O24425 Gestational diabetes mellitus in childbirth, controlled by oral hypoglycemic drugs: Principal | ICD-10-CM | POA: Diagnosis present

## 2022-12-26 DIAGNOSIS — O4103X0 Oligohydramnios, third trimester, not applicable or unspecified: Secondary | ICD-10-CM

## 2022-12-26 DIAGNOSIS — O24424 Gestational diabetes mellitus in childbirth, insulin controlled: Secondary | ICD-10-CM | POA: Diagnosis not present

## 2022-12-26 DIAGNOSIS — O24410 Gestational diabetes mellitus in pregnancy, diet controlled: Secondary | ICD-10-CM

## 2022-12-26 DIAGNOSIS — E669 Obesity, unspecified: Secondary | ICD-10-CM

## 2022-12-26 DIAGNOSIS — Z8759 Personal history of other complications of pregnancy, childbirth and the puerperium: Secondary | ICD-10-CM

## 2022-12-26 DIAGNOSIS — O34219 Maternal care for unspecified type scar from previous cesarean delivery: Secondary | ICD-10-CM | POA: Diagnosis not present

## 2022-12-26 DIAGNOSIS — Z3480 Encounter for supervision of other normal pregnancy, unspecified trimester: Secondary | ICD-10-CM

## 2022-12-26 DIAGNOSIS — Z3689 Encounter for other specified antenatal screening: Secondary | ICD-10-CM

## 2022-12-26 DIAGNOSIS — O4100X0 Oligohydramnios, unspecified trimester, not applicable or unspecified: Secondary | ICD-10-CM | POA: Insufficient documentation

## 2022-12-26 DIAGNOSIS — O9902 Anemia complicating childbirth: Secondary | ICD-10-CM | POA: Diagnosis present

## 2022-12-26 DIAGNOSIS — Z148 Genetic carrier of other disease: Secondary | ICD-10-CM

## 2022-12-26 DIAGNOSIS — O99019 Anemia complicating pregnancy, unspecified trimester: Secondary | ICD-10-CM | POA: Insufficient documentation

## 2022-12-26 DIAGNOSIS — O24415 Gestational diabetes mellitus in pregnancy, controlled by oral hypoglycemic drugs: Secondary | ICD-10-CM

## 2022-12-26 DIAGNOSIS — Z98891 History of uterine scar from previous surgery: Secondary | ICD-10-CM

## 2022-12-26 DIAGNOSIS — O34211 Maternal care for low transverse scar from previous cesarean delivery: Secondary | ICD-10-CM | POA: Diagnosis present

## 2022-12-26 DIAGNOSIS — Z3490 Encounter for supervision of normal pregnancy, unspecified, unspecified trimester: Principal | ICD-10-CM | POA: Diagnosis present

## 2022-12-26 DIAGNOSIS — D563 Thalassemia minor: Secondary | ICD-10-CM | POA: Diagnosis present

## 2022-12-26 DIAGNOSIS — O09823 Supervision of pregnancy with history of in utero procedure during previous pregnancy, third trimester: Secondary | ICD-10-CM | POA: Diagnosis not present

## 2022-12-26 DIAGNOSIS — O285 Abnormal chromosomal and genetic finding on antenatal screening of mother: Secondary | ICD-10-CM

## 2022-12-26 DIAGNOSIS — O24419 Gestational diabetes mellitus in pregnancy, unspecified control: Secondary | ICD-10-CM | POA: Insufficient documentation

## 2022-12-26 DIAGNOSIS — O09299 Supervision of pregnancy with other poor reproductive or obstetric history, unspecified trimester: Secondary | ICD-10-CM

## 2022-12-26 DIAGNOSIS — O09293 Supervision of pregnancy with other poor reproductive or obstetric history, third trimester: Secondary | ICD-10-CM

## 2022-12-26 DIAGNOSIS — O99214 Obesity complicating childbirth: Secondary | ICD-10-CM | POA: Diagnosis present

## 2022-12-26 DIAGNOSIS — D582 Other hemoglobinopathies: Secondary | ICD-10-CM

## 2022-12-26 LAB — CBC
HCT: 38.2 % (ref 36.0–46.0)
Hemoglobin: 12.7 g/dL (ref 12.0–15.0)
MCH: 27.3 pg (ref 26.0–34.0)
MCHC: 33.2 g/dL (ref 30.0–36.0)
MCV: 82.2 fL (ref 80.0–100.0)
Platelets: 200 10*3/uL (ref 150–400)
RBC: 4.65 MIL/uL (ref 3.87–5.11)
RDW: 13.4 % (ref 11.5–15.5)
WBC: 8 10*3/uL (ref 4.0–10.5)
nRBC: 0 % (ref 0.0–0.2)

## 2022-12-26 LAB — TYPE AND SCREEN
ABO/RH(D): B POS
Antibody Screen: NEGATIVE

## 2022-12-26 LAB — RPR: RPR Ser Ql: NONREACTIVE

## 2022-12-26 LAB — STREP GP B NAA: Strep Gp B NAA: NEGATIVE

## 2022-12-26 LAB — GLUCOSE, CAPILLARY: Glucose-Capillary: 105 mg/dL — ABNORMAL HIGH (ref 70–99)

## 2022-12-26 MED ORDER — FENTANYL CITRATE (PF) 100 MCG/2ML IJ SOLN
50.0000 ug | INTRAMUSCULAR | Status: DC | PRN
  Administered 2022-12-26 (×2): 100 ug via INTRAVENOUS
  Filled 2022-12-26 (×3): qty 2

## 2022-12-26 MED ORDER — ONDANSETRON HCL 4 MG/2ML IJ SOLN: 4.0000 mg | Freq: Four times a day (QID) | INTRAMUSCULAR | Status: DC | PRN

## 2022-12-26 MED ORDER — OXYCODONE-ACETAMINOPHEN 5-325 MG PO TABS: 1.0000 | ORAL_TABLET | ORAL | Status: DC | PRN

## 2022-12-26 MED ORDER — EPHEDRINE 5 MG/ML INJ: 10.0000 mg | INTRAVENOUS | Status: DC | PRN

## 2022-12-26 MED ORDER — DIPHENHYDRAMINE HCL 50 MG/ML IJ SOLN: 12.5000 mg | INTRAMUSCULAR | Status: DC | PRN

## 2022-12-26 MED ORDER — OXYTOCIN-SODIUM CHLORIDE 30-0.9 UT/500ML-% IV SOLN
2.5000 [IU]/h | INTRAVENOUS | Status: DC
  Filled 2022-12-26: qty 500

## 2022-12-26 MED ORDER — FENTANYL-BUPIVACAINE-NACL 0.5-0.125-0.9 MG/250ML-% EP SOLN
12.0000 mL/h | EPIDURAL | Status: DC | PRN
  Filled 2022-12-26: qty 250

## 2022-12-26 MED ORDER — SOD CITRATE-CITRIC ACID 500-334 MG/5ML PO SOLN: 30.0000 mL | ORAL | Status: DC | PRN

## 2022-12-26 MED ORDER — PHENYLEPHRINE 80 MCG/ML (10ML) SYRINGE FOR IV PUSH (FOR BLOOD PRESSURE SUPPORT): 80.0000 ug | PREFILLED_SYRINGE | INTRAVENOUS | Status: DC | PRN

## 2022-12-26 MED ORDER — TERBUTALINE SULFATE 1 MG/ML IJ SOLN: 0.2500 mg | Freq: Once | INTRAMUSCULAR | Status: DC | PRN

## 2022-12-26 MED ORDER — PHENYLEPHRINE 80 MCG/ML (10ML) SYRINGE FOR IV PUSH (FOR BLOOD PRESSURE SUPPORT)
80.0000 ug | PREFILLED_SYRINGE | INTRAVENOUS | Status: DC | PRN
  Filled 2022-12-26: qty 10

## 2022-12-26 MED ORDER — LACTATED RINGERS IV SOLN
500.0000 mL | Freq: Once | INTRAVENOUS | Status: AC
  Administered 2022-12-27: 500 mL via INTRAVENOUS

## 2022-12-26 MED ORDER — LIDOCAINE HCL (PF) 1 % IJ SOLN: 30.0000 mL | INTRAMUSCULAR | Status: DC | PRN

## 2022-12-26 MED ORDER — LACTATED RINGERS IV SOLN: INTRAVENOUS | Status: DC

## 2022-12-26 MED ORDER — OXYTOCIN BOLUS FROM INFUSION
333.0000 mL | Freq: Once | INTRAVENOUS | Status: AC
  Administered 2022-12-27: 333 mL via INTRAVENOUS

## 2022-12-26 MED ORDER — LACTATED RINGERS IV SOLN
500.0000 mL | INTRAVENOUS | Status: DC | PRN
  Administered 2022-12-27: 500 mL via INTRAVENOUS

## 2022-12-26 MED ORDER — ACETAMINOPHEN 325 MG PO TABS: 650.0000 mg | ORAL_TABLET | ORAL | Status: DC | PRN

## 2022-12-26 MED ORDER — OXYTOCIN-SODIUM CHLORIDE 30-0.9 UT/500ML-% IV SOLN
1.0000 m[IU]/min | INTRAVENOUS | Status: DC
  Administered 2022-12-26: 2 m[IU]/min via INTRAVENOUS
  Filled 2022-12-26: qty 500

## 2022-12-26 MED ORDER — OXYCODONE-ACETAMINOPHEN 5-325 MG PO TABS: 2.0000 | ORAL_TABLET | ORAL | Status: DC | PRN

## 2022-12-26 NOTE — Anesthesia Preprocedure Evaluation (Addendum)
Anesthesia Evaluation  Patient identified by MRN, date of birth, ID band Patient awake    Reviewed: Allergy & Precautions, NPO status , Patient's Chart, lab work & pertinent test results  Airway Mallampati: II  TM Distance: >3 FB Neck ROM: Full    Dental no notable dental hx. (+) Teeth Intact   Pulmonary neg pulmonary ROS   Pulmonary exam normal breath sounds clear to auscultation       Cardiovascular Exercise Tolerance: Good negative cardio ROS Normal cardiovascular exam Rhythm:Regular Rate:Normal     Neuro/Psych negative neurological ROS     GI/Hepatic negative GI ROS, Neg liver ROS,,,  Endo/Other  diabetes, Gestational    Renal/GU negative Renal ROS     Musculoskeletal negative musculoskeletal ROS (+)    Abdominal  (+) + obese (BMI 36.)  Peds  Hematology Lab Results      Component                Value               Date                      WBC                      8.0                 12/26/2022                HGB                      12.7                12/26/2022                HCT                      38.2                12/26/2022                MCV                      82.2                12/26/2022                PLT                      200                 12/26/2022              Anesthesia Other Findings   Reproductive/Obstetrics (+) Pregnancy                             Anesthesia Physical Anesthesia Plan  ASA: 3  Anesthesia Plan: Epidural   Post-op Pain Management:    Induction:   PONV Risk Score and Plan:   Airway Management Planned:   Additional Equipment:   Intra-op Plan:   Post-operative Plan:   Informed Consent: I have reviewed the patients History and Physical, chart, labs and discussed the procedure including the risks, benefits and alternatives for the proposed anesthesia with the patient or authorized representative who has indicated his/her  understanding and acceptance.  Plan Discussed with:   Anesthesia Plan Comments: (37.5 wk G8P2 w gDm, BMI 36 for [place])       Anesthesia Quick Evaluation

## 2022-12-26 NOTE — H&P (Addendum)
Veronica Bradley is a 32 y.o. female presenting for IOL for Oligohydramnios and A2GDM on metformin .  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    OB History     Gravida  8   Para  2   Term  1   Preterm  1   AB  5   Living  2      SAB  0   IAB  5   Ectopic  0   Multiple  0   Live Births  2        Obstetric Comments  Second baby was 7#        Past Medical History:  Diagnosis Date   Depression    post partum   Eczema    Gestational diabetes    Other allergic rhinitis 07/26/2021   Other atopic dermatitis 07/26/2021   Prediabetes    Seasonal allergies    Past Surgical History:  Procedure Laterality Date   brazillian butt lift  2020   CESAREAN SECTION     DILATION AND CURETTAGE OF UTERUS     X5   LIPOSUCTION     removed from abd and back- infected into hips and butt   WISDOM TOOTH EXTRACTION     Family History: family history includes Allergic rhinitis in her maternal grandmother and mother; Diabetes in her maternal grandfather, maternal grandmother, and mother; Eczema in her father; Healthy in her paternal grandfather; Hypertension in her maternal aunt; Kidney failure in her maternal grandfather and maternal grandmother; Other in her father. Social History:  reports that she has never smoked. She has never used smokeless tobacco. She reports that she does not currently use alcohol after a past usage of about 1.0 standard drink of alcohol per week. She reports that she does not currently use drugs after having used the following drugs: Marijuana.      Nursing Staff Provider  Office Location  [facility] Dating  01/12/2023, by Last Menstrual Period  [facility] Model Arly.Keller ] Traditional [ ]  Centering [ ]  Mom-Baby Dyad Anatomy US  Normal  Language   English      Flu Vaccine    Genetic/Carrier Screen  NIPS:   LR female AFP:   neg Horizon: Alpha thal, SMA, & SCT   TDaP Vaccine   DECLINED 12/09/21 Hgb A1C or  GTT Early 6.3 -- elevated 2h  Third trimester   COVID Vaccine  Yes   LAB RESULTS   Rhogam  B/Positive/-- (11/27 1437)  Blood Type B/Positive/-- (11/27 1437)   Baby Feeding Plan  Breast Antibody Negative (11/27 1437)  Contraception  Depo Rubella 4.52 (11/27 1437)  Circumcision  Yes if female RPR Non Reactive (03/11 1038)   Pediatrician    HBsAg Negative (11/27 1437)   Support Person  Gardiner Barefoot HCVAb Non Reactive (11/27 1437)   Prenatal Classes   HIV Non Reactive (03/11 1038)     BTL Consent   GBS  Negative   VBAC Consent   Pap             DME Rx Arly.Keller ] BP cuff [ ]  Weight Scale Waterbirth  [ ]  Class [ ]  Consent [ ]  CNM visit  PHQ9 & GAD7 [ X ] new OB [ X] 28 weeks  [ x] 36 weeks Induction  [ ]  Orders Entered [ ] Foley Y/N    Ultrasound imaging from 12/26/22  "  On today's exam, oligohydramnios with a total AFI of 4.39 cm  was noted.  A 2 x 2 centimeter pocket of fluid was not present.  A biophysical profile performed today was 8 out of 10 with a  reactive NST.  The fetus received a -2 as a 2 cm pocket of amniotic fluid was not present. Due to oligohydramnios where a 2 cm maximal vertical  pocket was not present, delivery is recommended at this time.  Review of Systems Maternal Medical History:  Prenatal Complications - Diabetes: gestational. Diabetes is managed by oral agent (monotherapy).     Dilation: Fingertip Effacement (%): Thick Station: -3 Exam by:: Olivia P RN Blood pressure (!) 105/54, pulse 77, resp. rate 19, last menstrual period 04/07/2022. Maternal Exam:  Uterine Assessment: Contraction strength is mild.  Contraction frequency is irregular.  Cervix: Cervix evaluated by digital exam.     Fetal Exam Fetal Monitor Review: Baseline rate: 135.  Variability: moderate (6-25 bpm).   Pattern: accelerations present and no decelerations.   Fetal State Assessment: Category I - tracings are normal.   Physical Exam  Prenatal  labs: ABO, Rh: --/--/B POS (05/08 1057) Antibody: NEG (05/08 1057) Rubella: 4.52 (11/27 1437) RPR: NON REACTIVE (05/08 1047)  HBsAg: Negative (11/27 1437)  HIV: Non Reactive (03/11 1038)  GBS: Negative/-- (05/06 1035)   Assessment/Plan: - Admit to L&D  - Start IOL with Low dose pit, then assess for FB placement.  - Breastfeeding (exclusively pumping)  - Depo for contraception  - GBS negative  - FHT Cat I  - Circ planned for baby boy  - Planning VBAC at this time.    Claudette Head, MSN CNM  12/26/2022, 4:21 PM

## 2022-12-26 NOTE — Procedures (Signed)
Veronica Bradley 1991-05-15 [redacted]w[redacted]d  Fetus A Non-Stress Test Interpretation for 12/26/22  Indication: Gestational Diabetes medication controlled and Oligohydramnios  Fetal Heart Rate A Mode: External Baseline Rate (A): 135 bpm Variability: Moderate Accelerations: 15 x 15 Decelerations: None Multiple birth?: No  Uterine Activity Mode: Toco Contraction Frequency (min): occas Contraction Duration (sec): 60 Contraction Quality: Mild Resting Tone Palpated: Relaxed  Interpretation (Fetal Testing) Nonstress Test Interpretation: Reactive Overall Impression: Reassuring for gestational age Comments: tracing reviewed byDr. Parke Poisson

## 2022-12-26 NOTE — Progress Notes (Signed)
Patient ID: Veronica Bradley, female   DOB: 1991-04-20, 32 y.o.   MRN: [id number]  Just rec'd Fentanyl and is resting; cervical foley in place  BPs 127/64, 115/67 FHR 120s, +accels, no decels Ctx irreg 2-6 mins with Pit @ 97mu/min Cx deferred  CBG 105  IUP@37 .4wks GDMA2 Cx unfavorable TOLAC Oligo  Plan for AROM when foley comes out Anticipate VBAC, and will have SD precautions in place  Veronica Bradley [facility] 12/26/2022 9:19 PM

## 2022-12-26 NOTE — Progress Notes (Signed)
MFM Note  Veronica Bradley was seen for a BPP due to maternal obesity and gestational diabetes treated with metformin.  Low normal amniotic fluid with and AFI of around 6 cm has been noted for the past 2 weeks.  She denies any leakage of fluid. She is currently at 37 weeks and 4 days.  Her past pregnancy history includes one vaginal delivery where shoulder dystocia was encountered and a cesarean delivery.   On today's exam, oligohydramnios with a total AFI of 4.39 cm was noted.  A 2 x 2 centimeter pocket of fluid was not present.  A biophysical profile performed today was 8 out of 10 with a reactive NST.  The fetus received a -2 as a 2 cm pocket of amniotic fluid was not present.  Due to oligohydramnios where a 2 cm maximal vertical pocket was not present, delivery is recommended at this time.    I discussed her case with the first attending, they will contact the patient and will bring her in for induction once room is available on labor and delivery.    The patient is happy and comfortable with delivery today.    She stated that all of her questions were answered.  A total of 20 minutes was spent counseling and coordinating the care for this patient.  Greater than 50% of the time was spent in direct face-to-face contact.

## 2022-12-26 NOTE — Progress Notes (Signed)
Veronica Bradley is a 32 y.o. [id number] at [redacted]w[redacted]d by LMP admitted for induction of labor due to Low amniotic fluid A2GDM on Metformin.   Subjective: Patient doing well walking around the room. Excited for next steps in induction process.   Objective: BP 115/72   Pulse 72   Temp 98.6 F (37 C) (Oral)   Resp 20   LMP 04/07/2022 (Approximate)  No intake/output data recorded. No intake/output data recorded.  FHT:  FHR: 135 bpm, variability: moderate,  accelerations:  Present,  decelerations:  Absent UC:   regular, every 2-5 minutes SVE:   Dilation: 1 Effacement (%): 70 Station: -2 Exam by:: Dorathy Daft CNM  Labs: Lab Results  Component Value Date   WBC 8.0 12/26/2022   HGB 12.7 12/26/2022   HCT 38.2 12/26/2022   MCV 82.2 12/26/2022   PLT 200 12/26/2022   CBG (last 3)  Recent Labs    12/26/22 1155  GLUCAP 105*   CNM to patient bedside to discuss the placement of a foley balloon. CNM reviewed risks and benefits of FB. All questions answered at bedside. Patient desires to proceed with FB placement. FB placed with minimal difficulty. Balloon filled up with 60cc of LR. FHT remained cat I during the process and after insertion.   Assessment / Plan: Induction of labor due to Oligo and A2GDM on Metformin,  progressing well on pitocin  Labor: Progressing on Pitocin. FB placed. Once FB is expelled assess for AROM. Continue to titrate pit as needed 2x2 up to 22mil/u.  Fetal Wellbeing:  Category I- continuously monitor while on pit  Pain Control:  Epidural and IV pain meds I/D:   GBS negative  Anticipated MOD:   VBAC at this time   Claudette Head, CNM 12/26/2022, 6:30 PM

## 2022-12-27 ENCOUNTER — Encounter (HOSPITAL_COMMUNITY): Payer: Self-pay | Admitting: Obstetrics and Gynecology

## 2022-12-27 ENCOUNTER — Inpatient Hospital Stay (HOSPITAL_COMMUNITY): Payer: Medicaid Other | Admitting: Anesthesiology

## 2022-12-27 DIAGNOSIS — O09823 Supervision of pregnancy with history of in utero procedure during previous pregnancy, third trimester: Secondary | ICD-10-CM

## 2022-12-27 DIAGNOSIS — O34211 Maternal care for low transverse scar from previous cesarean delivery: Secondary | ICD-10-CM

## 2022-12-27 DIAGNOSIS — O9902 Anemia complicating childbirth: Secondary | ICD-10-CM

## 2022-12-27 DIAGNOSIS — O34219 Maternal care for unspecified type scar from previous cesarean delivery: Secondary | ICD-10-CM | POA: Diagnosis not present

## 2022-12-27 DIAGNOSIS — O4103X0 Oligohydramnios, third trimester, not applicable or unspecified: Secondary | ICD-10-CM

## 2022-12-27 DIAGNOSIS — O24424 Gestational diabetes mellitus in childbirth, insulin controlled: Secondary | ICD-10-CM

## 2022-12-27 DIAGNOSIS — Z3A37 37 weeks gestation of pregnancy: Secondary | ICD-10-CM

## 2022-12-27 LAB — GLUCOSE, CAPILLARY
Glucose-Capillary: 80 mg/dL (ref 70–99)
Glucose-Capillary: 95 mg/dL (ref 70–99)
Glucose-Capillary: 88 mg/dL (ref 70–99)
Glucose-Capillary: 82 mg/dL (ref 70–99)
Glucose-Capillary: 59 mg/dL — ABNORMAL LOW (ref 70–99)
Glucose-Capillary: 74 mg/dL (ref 70–99)

## 2022-12-27 MED ORDER — MAGNESIUM HYDROXIDE 400 MG/5ML PO SUSP: 30.0000 mL | ORAL | Status: DC | PRN

## 2022-12-27 MED ORDER — OXYCODONE-ACETAMINOPHEN 5-325 MG PO TABS
1.0000 | ORAL_TABLET | ORAL | Status: DC | PRN
  Administered 2022-12-27: 1 via ORAL
  Filled 2022-12-27: qty 1

## 2022-12-27 MED ORDER — METHYLERGONOVINE MALEATE 0.2 MG/ML IJ SOLN
0.2000 mg | Freq: Once | INTRAMUSCULAR | Status: AC
  Administered 2022-12-27: 0.2 mg via INTRAMUSCULAR

## 2022-12-27 MED ORDER — WITCH HAZEL-GLYCERIN EX PADS: 1.0000 | MEDICATED_PAD | CUTANEOUS | Status: DC | PRN

## 2022-12-27 MED ORDER — MEDROXYPROGESTERONE ACETATE 150 MG/ML IM SUSP
150.0000 mg | Freq: Once | INTRAMUSCULAR | Status: AC
  Administered 2022-12-28: 150 mg via INTRAMUSCULAR
  Filled 2022-12-27: qty 1

## 2022-12-27 MED ORDER — BENZOCAINE-MENTHOL 20-0.5 % EX AERO: 1.0000 | INHALATION_SPRAY | CUTANEOUS | Status: DC | PRN

## 2022-12-27 MED ORDER — ONDANSETRON HCL 4 MG PO TABS: 4.0000 mg | ORAL_TABLET | ORAL | Status: DC | PRN

## 2022-12-27 MED ORDER — PRENATAL MULTIVITAMIN CH
1.0000 | ORAL_TABLET | Freq: Every day | ORAL | Status: DC
  Administered 2022-12-28: 1 via ORAL
  Filled 2022-12-27: qty 1

## 2022-12-27 MED ORDER — DIPHENHYDRAMINE HCL 25 MG PO CAPS: 25.0000 mg | ORAL_CAPSULE | Freq: Four times a day (QID) | ORAL | Status: DC | PRN

## 2022-12-27 MED ORDER — ONDANSETRON HCL 4 MG/2ML IJ SOLN: 4.0000 mg | INTRAMUSCULAR | Status: DC | PRN

## 2022-12-27 MED ORDER — FENTANYL-BUPIVACAINE-NACL 0.5-0.125-0.9 MG/250ML-% EP SOLN
EPIDURAL | Status: DC | PRN
  Administered 2022-12-27: 12 mL/h via EPIDURAL

## 2022-12-27 MED ORDER — METHYLERGONOVINE MALEATE 0.2 MG/ML IJ SOLN
INTRAMUSCULAR | Status: AC
  Filled 2022-12-27: qty 1

## 2022-12-27 MED ORDER — DIBUCAINE (PERIANAL) 1 % EX OINT: 1.0000 | TOPICAL_OINTMENT | CUTANEOUS | Status: DC | PRN

## 2022-12-27 MED ORDER — ACETAMINOPHEN 325 MG PO TABS: 650.0000 mg | ORAL_TABLET | ORAL | Status: DC | PRN

## 2022-12-27 MED ORDER — COCONUT OIL OIL: 1.0000 | TOPICAL_OIL | Status: DC | PRN

## 2022-12-27 MED ORDER — SIMETHICONE 80 MG PO CHEW: 80.0000 mg | CHEWABLE_TABLET | ORAL | Status: DC | PRN

## 2022-12-27 MED ORDER — IBUPROFEN 600 MG PO TABS
600.0000 mg | ORAL_TABLET | Freq: Four times a day (QID) | ORAL | Status: DC
  Administered 2022-12-27 – 2022-12-28 (×4): 600 mg via ORAL
  Filled 2022-12-27 (×3): qty 1

## 2022-12-27 MED ORDER — LIDOCAINE HCL (PF) 1 % IJ SOLN
INTRAMUSCULAR | Status: DC | PRN
  Administered 2022-12-27: 5 mL via EPIDURAL

## 2022-12-27 MED ORDER — OXYCODONE-ACETAMINOPHEN 5-325 MG PO TABS
2.0000 | ORAL_TABLET | ORAL | Status: DC | PRN
  Administered 2022-12-28: 2 via ORAL
  Filled 2022-12-27: qty 2

## 2022-12-27 MED ORDER — MEASLES, MUMPS & RUBELLA VAC IJ SOLR: 0.5000 mL | Freq: Once | INTRAMUSCULAR | Status: DC

## 2022-12-27 MED ORDER — TETANUS-DIPHTH-ACELL PERTUSSIS 5-2.5-18.5 LF-MCG/0.5 IM SUSY: 0.5000 mL | PREFILLED_SYRINGE | Freq: Once | INTRAMUSCULAR | Status: DC

## 2022-12-27 NOTE — Progress Notes (Signed)
After CBG of 59 at 9:04, patient was given [place] ice.CBG was repeated at 9:34 and was 74.

## 2022-12-27 NOTE — Discharge Summary (Signed)
Postpartum Discharge Summary      Patient Name: Veronica Bradley DOB: Feb 09, 1991 MRN: [id number]  Date of admission: 12/26/2022 Delivery date:12/27/2022  Delivering provider: Dorathy Kinsman  Date of discharge: 12/28/2022  Admitting diagnosis: Encounter for induction of labor [Z34.90] Intrauterine pregnancy: [redacted]w[redacted]d     Secondary diagnosis:  Principal Problem:   Encounter for induction of labor Active Problems:   Alpha thalassemia silent carrier   Carrier of spinal muscular atrophy   Sickle cell trait (HCC)   Prior cesarean delivery   History of shoulder dystocia in prior pregnancy   GDM, class A2   Oligohydramnios   Vaginal delivery   VBAC (vaginal birth after Cesarean)  Additional problems: none    Discharge diagnosis: Term Pregnancy Delivered, VBAC, and GDM A2                                              Post partum procedures: none Augmentation: AROM, Pitocin, and IP Foley Complications: None  Hospital course: Induction of Labor With Vaginal Delivery   32 y.o. yo [phone] at [redacted]w[redacted]d was admitted to the hospital 12/26/2022 for induction of labor.  Indication for induction:  oligohydramnios .  Patient had an labor course complicated by variable decels, low-grade temp Membrane Rupture Time/Date: 12:01 AM ,12/27/2022   Delivery Method:Vaginal, Spontaneous  Episiotomy: None  Lacerations:  None  Details of delivery can be found in separate delivery note.  Patient had a postpartum course complicated by nothing. Postpartum fasting glucose was not assessed. Patient is discharged home 12/28/22.  Newborn Data: Birth date:12/27/2022  Birth time:12:43 PM  Gender:Female  Living status:Living  Apgars:9 ,9  Weight:3010 g   Magnesium Sulfate received: No BMZ received: No Rhophylac:N/A MMR:N/A Transfusion:No  Physical exam  Vitals:   12/27/22 2253 12/28/22 0224 12/28/22 0530 12/28/22 1245  BP: 107/73 100/66 102/64 106/79  Pulse: 78 72 70 80  Resp: 18 18 18 16   Temp: 97.9 F (36.6 C)  98.1 F (36.7 C) 98 F (36.7 C) 98.2 F (36.8 C)  TempSrc: Oral Oral Oral Oral  SpO2: 98% 100% 98%   Weight:      Height:       General: alert, cooperative, and no distress Lochia: appropriate Uterine Fundus: firm Incision: N/A DVT Evaluation: No cords or calf tenderness. Labs: Lab Results  Component Value Date   WBC 15.9 (H) 12/28/2022   HGB 10.0 (L) 12/28/2022   HCT 28.6 (L) 12/28/2022   MCV 80.3 12/28/2022   PLT 175 12/28/2022      Latest Ref Rng & Units 04/27/2016   12:19 AM  CMP  Glucose 65 - 99 mg/dL 94   BUN 6 - 20 mg/dL 10   Creatinine 5.78 - 1.00 mg/dL 4.69   Sodium 629 - 528 mmol/L 135   Potassium 3.5 - 5.1 mmol/L 3.6   Chloride 101 - 111 mmol/L 106   CO2 22 - 32 mmol/L 22   Calcium 8.9 - 10.3 mg/dL 9.3   Total Protein 6.5 - 8.1 g/dL 6.4   Total Bilirubin 0.3 - 1.2 mg/dL 0.3   Alkaline Phos 38 - 126 U/L 44   AST 15 - 41 U/L 17   ALT 14 - 54 U/L 16    Edinburgh Score:    12/27/2022    6:21 PM  Edinburgh Postnatal Depression Scale Screening Tool  I have been able  to laugh and see the funny side of things. 0  I have looked forward with enjoyment to things. 0  I have blamed myself unnecessarily when things went wrong. 1  I have been anxious or worried for no good reason. 2  I have felt scared or panicky for no good reason. 0  Things have been getting on top of me. 2  I have been so unhappy that I have had difficulty sleeping. 2  I have felt sad or miserable. 2  I have been so unhappy that I have been crying. 2  The thought of harming myself has occurred to me. 0  Edinburgh Postnatal Depression Scale Total 11     After visit meds:  Allergies as of 12/28/2022       Reactions   Shellfish Allergy Itching        Medication List     STOP taking these medications    Accu-Chek Guide test strip Generic drug: glucose blood   Accu-Chek Guide w/Device Kit   Accu-Chek Softclix Lancets lancets   aspirin 81 MG chewable tablet   Blood Pressure Kit  Devi   clotrimazole 1 % vaginal cream Commonly known as: GYNE-LOTRIMIN   docusate sodium 250 MG capsule Commonly known as: COLACE   metFORMIN 500 MG tablet Commonly known as: GLUCOPHAGE       TAKE these medications    acetaminophen 325 MG tablet Commonly known as: Tylenol Take 2 tablets (650 mg total) by mouth every 4 (four) hours as needed (for pain scale < 4).   EPINEPHrine 0.3 mg/0.3 mL Soaj injection Commonly known as: EPI-PEN Inject 0.3 mg into the muscle as needed for anaphylaxis.   ibuprofen 600 MG tablet Commonly known as: ADVIL Take 1 tablet (600 mg total) by mouth every 6 (six) hours.   Prenatal 28-0.8 MG Tabs Take 1 tablet by mouth daily.         Discharge home in stable condition Infant Feeding: Breast Infant Disposition:home with mother Discharge instruction: per After Visit Summary and Postpartum booklet. Activity: Advance as tolerated. Pelvic rest for 6 weeks.  Diet: routine diet Future Appointments: Future Appointments  Date Time Provider Department Center  02/07/2023  9:00 AM [facility] None  02/07/2023  9:15 AM Brock Bad, MD [facility] None   Follow up Visit:  Follow-up Information     [facility] for [facility]. Schedule an appointment as soon as possible for a visit in 4 week(s).   Specialty: Obstetrics and Gynecology Contact information: [address] [place] [address] [phone]                 Please schedule this patient for a In person postpartum visit in 6 weeks with the following provider: Any provider. Additional Postpartum F/U:2 hour GTT  High risk pregnancy complicated by: GDM Delivery mode:  Vaginal, Spontaneous  Birth Control:  Depo, given in hospital prior to discharge   12/28/2022 Silvano Bilis, MD

## 2022-12-27 NOTE — Progress Notes (Signed)
Patient ID: Veronica Bradley, female   DOB: [birth year], 32 y.o.   MRN: [id number]  Comfortable w epidural  BP 98/61 FHR 130s, +accels, early variables Ctx q 2-3 mins with Pit @ 58mu/min Cx per RN @ [phone] 6/70/vtx -2  CBGs: 98, 88, 82  IUP@37 .5wks GDMA2 TOLAC Oligo Early active labor  Plan to check cx in next couple of hours; if no change, will probably go with IUPC insertion Hopeful for VBAC  Arabella Merles [facility] 12/27/2022 7:29 AM

## 2022-12-27 NOTE — Progress Notes (Signed)
Veronica Bradley is a 32 y.o. [id number] at [redacted]w[redacted]d.  Subjective: Mild-mod pelvic pressure w/ UC's. No different that when RN checked recently.   Objective: BP 118/76   Pulse 84   Temp 99.1 F (37.3 C) (Oral)   Resp 16   Ht 5\' 1"  (1.549 m)   Wt 86.5 kg   LMP 04/07/2022 (Approximate)   SpO2 100%   BMI 36.03 kg/m    FHT:  FHR: 140 bpm, variability: mod,  accelerations:  15x15,  decelerations:  variables and early decels UC:   Q 2-5 minutes, strong Dilation: Lip/rim Effacement (%): 80 Cervical Position: Middle Station: 0 Presentation: Vertex Exam by:: k fields, rn  Labs: Results for orders placed or performed during the hospital encounter of 12/26/22 (from the past 24 hour(s))  Glucose, capillary     Status: Abnormal   Collection Time: 12/26/22 11:55 AM  Result Value Ref Range   Glucose-Capillary 105 (H) 70 - 99 mg/dL  Glucose, capillary     Status: None   Collection Time: 12/26/22  4:50 PM  Result Value Ref Range   Glucose-Capillary 80 70 - 99 mg/dL  Glucose, capillary     Status: None   Collection Time: 12/26/22  8:59 PM  Result Value Ref Range   Glucose-Capillary 95 70 - 99 mg/dL  Glucose, capillary     Status: None   Collection Time: 12/27/22  1:23 AM  Result Value Ref Range   Glucose-Capillary 88 70 - 99 mg/dL  Glucose, capillary     Status: None   Collection Time: 12/27/22  5:19 AM  Result Value Ref Range   Glucose-Capillary 82 70 - 99 mg/dL  Glucose, capillary     Status: Abnormal   Collection Time: 12/27/22  9:04 AM  Result Value Ref Range   Glucose-Capillary 59 (L) 70 - 99 mg/dL  Glucose, capillary     Status: None   Collection Time: 12/27/22  9:35 AM  Result Value Ref Range   Glucose-Capillary 74 70 - 99 mg/dL    Assessment / Plan: [redacted]w[redacted]d week IUP Labor: transition, progressing well.  Fetal Wellbeing:  Category I-II. Overall reassuring w/ mod variability and spontaneous accels.  Pain Control:  Epidural Anticipated MOD:  Optimistic for vaginal delivery.  Lengthy conversation about previous shoulder dystocia. This baby Leopold's smaller but extrapolates (by [place] 2 weeks ago)  to about the same at the her first baby with that she had the SD with. Vacuum was used for that baby. Pt doesn't know why. CNM explained that shoulder dystocia cannot always be predicted but that we we would be alert to signs that labor or pushing are not progressing well and would recommend C/S. Will not use vacuum. Also reiterated that pt can opt for C/S at any time. Pt comfortable continuing TOLAC at this time.   Katrinka Blazing, [place], CNM 12/27/2022 11:36 AM

## 2022-12-27 NOTE — Anesthesia Postprocedure Evaluation (Deleted)
Anesthesia Post Note  Patient: Veronica Bradley  Procedure(s) Performed: AN AD HOC LABOR EPIDURAL     Patient location during evaluation: Mother Baby Anesthesia Type: Epidural Level of consciousness: awake Pain management: satisfactory to patient Vital Signs Assessment: post-procedure vital signs reviewed and stable Respiratory status: spontaneous breathing Cardiovascular status: stable Anesthetic complications: no  No notable events documented.  Last Vitals:  Vitals:   12/27/22 0801 12/27/22 0831  BP: 129/70 120/75  Pulse: 100 100  Resp: 16   Temp:    SpO2:      Last Pain:  Vitals:   12/27/22 0827  TempSrc:   PainSc: 0-No pain   Pain Goal:                   Cephus Shelling

## 2022-12-27 NOTE — Progress Notes (Signed)
Patient ID: Shu Lacock, female   DOB: Feb 25, 1991, 32 y.o.   MRN: [id number]  S/p cervical foley; feeling ctx stronger  VSS, afebrile FHR 125-130s, +accels, no decels Ctx q 2-3 mins with Pit at 44mu/min Cx 4-5/60/vtx -2; AROM for clear/pink-tinged fluid  CBG: 90s  IUP@37 .5wks GDMA2 TOLAC Oligo IOL process  Hopeful that with AROM/Pitocin that she will get into active labor Anticipate VBAC  Arabella Merles [facility] 12/27/2022 12:08 AM

## 2022-12-27 NOTE — Progress Notes (Signed)
Taleigh Farver is a 32 y.o. [id number] at [redacted]w[redacted]d.  Subjective: Epidural.   Objective: 12/27/22 0704 98.4 F (36.9 C) -- -- -- -- -- -- -- -- -- -- SG  12/27/22 0702 -- 72 -- 16 98/61 -- -- -- -- -- --     FHT:  FHR: 130 bpm, variability: 15x15,  accelerations:  15x15,  decelerations:  variables, earlies, rare lates.  UC:   Q 2-4 minutes, strong Dilation: 6 Effacement (%): 80 Cervical Position: Middle Station: 0 Presentation: Vertex Exam by:: k fields, rn  Labs: Results for orders placed or performed during the hospital encounter of 12/26/22 (from the past 24 hour(s))  Glucose, capillary     Status: None   Collection Time: 12/26/22  4:50 PM  Result Value Ref Range   Glucose-Capillary 80 70 - 99 mg/dL  Glucose, capillary     Status: None   Collection Time: 12/26/22  8:59 PM  Result Value Ref Range   Glucose-Capillary 95 70 - 99 mg/dL  Glucose, capillary     Status: None   Collection Time: 12/27/22  1:23 AM  Result Value Ref Range   Glucose-Capillary 88 70 - 99 mg/dL  Glucose, capillary     Status: None   Collection Time: 12/27/22  5:19 AM  Result Value Ref Range   Glucose-Capillary 82 70 - 99 mg/dL  Glucose, capillary     Status: Abnormal   Collection Time: 12/27/22  9:04 AM  Result Value Ref Range   Glucose-Capillary 59 (L) 70 - 99 mg/dL  Glucose, capillary     Status: None   Collection Time: 12/27/22  9:35 AM  Result Value Ref Range   Glucose-Capillary 74 70 - 99 mg/dL    Assessment / Plan: [redacted]w[redacted]d week IUP Labor: Active Fetal Wellbeing:  Category II but overall reassuring with mod variability, accels Pain Control:  Epidural Anticipated MOD:  SVD A2GDM: GBS well controlled  Katrinka Blazing, [place], CNM 12/27/2022 08:45 AM

## 2022-12-27 NOTE — Progress Notes (Signed)
FSBS not crossing over to flowsheets  Capillary Blood Glucose readings as follows:  12/26/2022 @ 2100: (95) 12/27/2022 @ 0124: (88) 12/27/2022 @ 0519: (82)  Orvan Seen, RN

## 2022-12-27 NOTE — Anesthesia Postprocedure Evaluation (Signed)
Anesthesia Post Note  Patient: Veronica Bradley  Procedure(s) Performed: AN AD HOC LABOR EPIDURAL     Patient location during evaluation: Mother Baby Anesthesia Type: Epidural Level of consciousness: awake and alert and oriented Pain management: satisfactory to patient Vital Signs Assessment: post-procedure vital signs reviewed and stable Respiratory status: respiratory function stable Cardiovascular status: stable Postop Assessment: no headache, no backache, epidural receding, patient able to bend at knees, no signs of nausea or vomiting, adequate PO intake and able to ambulate Anesthetic complications: no   No notable events documented.  Last Vitals:  Vitals:   12/27/22 1440 12/27/22 1540  BP: 129/83 127/78  Pulse: 93 89  Resp: 16 18  Temp: 37 C 36.8 C  SpO2: 100% 99%    Last Pain:  Vitals:   12/27/22 1540  TempSrc: Oral  PainSc: 0-No pain   Pain Goal:                   Taronda Comacho

## 2022-12-27 NOTE — Anesthesia Procedure Notes (Signed)
Epidural Patient location during procedure: OB Start time: 12/27/2022 12:59 AM End time: 12/27/2022 1:13 AM  Staffing Anesthesiologist: Trevor Iha, MD Performed: anesthesiologist   Preanesthetic Checklist Completed: patient identified, IV checked, site marked, risks and benefits discussed, surgical consent, monitors and equipment checked, pre-op evaluation and timeout performed  Epidural Patient position: sitting Prep: DuraPrep and site prepped and draped Patient monitoring: continuous pulse ox and blood pressure Approach: midline Location: L3-L4 Injection technique: LOR air  Needle:  Needle type: Tuohy  Needle gauge: 17 G Needle length: 9 cm and 9 Needle insertion depth: 6 cm Catheter type: closed end flexible Catheter size: 19 Gauge Catheter at skin depth: 12 cm Test dose: negative  Assessment Events: blood not aspirated, no cerebrospinal fluid, injection not painful, no injection resistance, no paresthesia and negative IV test  Additional Notes Patient identified. Risks/Benefits/Options discussed with patient including but not limited to bleeding, infection, nerve damage, paralysis, failed block, incomplete pain control, headache, blood pressure changes, nausea, vomiting, reactions to medication both or allergic, itching and postpartum back pain. Confirmed with bedside nurse the patient's most recent platelet count. Confirmed with patient that they are not currently taking any anticoagulation, have any bleeding history or any family history of bleeding disorders. Patient expressed understanding and wished to proceed. All questions were answered. Sterile technique was used throughout the entire procedure. Please see nursing notes for vital signs. Test dose was given through epidural needle and negative prior to continuing to dose epidural or start infusion. Warning signs of high block given to the patient including shortness of breath, tingling/numbness in hands, complete motor  block, or any concerning symptoms with instructions to call for help. Patient was given instructions on fall risk and not to get out of bed. All questions and concerns addressed with instructions to call with any issues. 1 Attempt (S) . Patient tolerated procedure well.

## 2022-12-28 LAB — CBC
HCT: 28.6 % — ABNORMAL LOW (ref 36.0–46.0)
Hemoglobin: 10 g/dL — ABNORMAL LOW (ref 12.0–15.0)
MCH: 28.1 pg (ref 26.0–34.0)
MCHC: 35 g/dL (ref 30.0–36.0)
MCV: 80.3 fL (ref 80.0–100.0)
Platelets: 175 10*3/uL (ref 150–400)
RBC: 3.56 MIL/uL — ABNORMAL LOW (ref 3.87–5.11)
RDW: 13.5 % (ref 11.5–15.5)
WBC: 15.9 10*3/uL — ABNORMAL HIGH (ref 4.0–10.5)
nRBC: 0 % (ref 0.0–0.2)

## 2022-12-28 MED ORDER — IBUPROFEN 600 MG PO TABS: 600.0000 mg | ORAL_TABLET | Freq: Four times a day (QID) | ORAL | 0 refills | Status: DC

## 2022-12-28 MED ORDER — ACETAMINOPHEN 325 MG PO TABS: 650.0000 mg | ORAL_TABLET | ORAL | 2 refills | Status: DC | PRN
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
## 2022-12-28 NOTE — Lactation Note (Signed)
This note was copied from a baby's chart. Lactation Consultation Note  Patient Name: Veronica Bradley [id number]'[id number] Date: 12/28/2022 Age:34 hours   LC was called to see the birth parent but the birth parent was asleep when the [facility] entered the room. Lactation will follow-up with the birth parent later.   Maternal Data    Feeding Nipple Type: Slow - flow  LATCH Score                    Lactation Tools Discussed/Used    Interventions    Discharge    Consult Status      Veronica Bradley 12/28/2022, 11:55 AM

## 2022-12-28 NOTE — Lactation Note (Signed)
This note was copied from a baby's chart. Lactation Consultation Note  Patient Name: Veronica Bradley UEAVW'U Date: 12/28/2022 Age:34 hours Reason for consult: Initial assessment;Early term 37-38.6wks;Breastfeeding assistance;Infant weight loss;Maternal endocrine disorder (0.66% WL)  Infant was at 21 hours old.  LC entered the room as the  MD was leaving.  The birth parent was changing the infant.  Per the birth parent she has been pumping because she was afraid to latch the infant.  She said that she breast fed her older children but exclusively pumped because the latch was painful.  LC assisted the birth parent with putting the infant to the left and right breasts in the football position.  The infant took a few sucks before coming off the breast.  The RN took the infant for his circumcision procedure.  LC spoke with the birth parent about how to latch the infant showing her how to place her hands on the breast.  LC also educated the birth parent on milk production, supply and demand, and the importance of stimulation.  LC reviewed the outpatient lactation services brochure.  The birth parent knows to put the infant to the breast prior to supplementing with formula.  The birth parent will continue to pump if the infant does not latch for at least 15 min.  All questions were answered.   Infant Feeding Plan:  Breastfeed 8+ times in 24 hours according to feeding cues.  Put the infant to the breast prior to supplementing with formula.  Pump after feedings if the infant is not staying on the breast for at least 15 min.  Call RN/LC for assistance with breastfeeding.    Maternal Data Has patient been taught Hand Expression?: Yes Does the patient have breastfeeding experience prior to this delivery?: Yes How long did the patient breastfeed?: 4 months and 5-6 months  Feeding Mother's Current Feeding Choice: Breast Milk and Formula Nipple Type: Slow - flow  LATCH Score Latch: Repeated  attempts needed to sustain latch, nipple held in mouth throughout feeding, stimulation needed to elicit sucking reflex.  Audible Swallowing: A few with stimulation  Type of Nipple: Everted at rest and after stimulation  Comfort (Breast/Nipple): Soft / non-tender  Hold (Positioning): Assistance needed to correctly position infant at breast and maintain latch.  LATCH Score: 7   Lactation Tools Discussed/Used    Interventions Interventions: Breast feeding basics reviewed;Assisted with latch;Adjust position;Support pillows;Education;LC Services brochure  Discharge Pump: Personal;DEBP WIC Program: Yes  Consult Status Consult Status: Follow-up Date: 12/29/22 Follow-up type: In-patient    Orvil Feil Kiel Cockerell 12/28/2022, 2:22 PM

## 2022-12-31 ENCOUNTER — Encounter: Payer: Self-pay | Admitting: Obstetrics and Gynecology

## 2023-01-02 ENCOUNTER — Ambulatory Visit: Payer: Medicaid Other

## 2023-01-05 ENCOUNTER — Inpatient Hospital Stay (HOSPITAL_COMMUNITY): Payer: Medicaid Other

## 2023-01-07 ENCOUNTER — Encounter: Payer: Self-pay | Admitting: Obstetrics and Gynecology

## 2023-01-07 ENCOUNTER — Telehealth (HOSPITAL_COMMUNITY): Payer: Self-pay | Admitting: *Deleted

## 2023-01-07 NOTE — Telephone Encounter (Signed)
Patient voiced no questions or concerns regarding her health at this time. EPDS=4. Patient voiced no questions or concerns regarding infant at this time. RN reviewed ABCs of safe sleep. Patient verbalized understanding. Patient informed about hospital's postpartum classes and support groups. Requested email information about Baby and Me class and maternal mental health resources. Email sent. Deforest Hoyles, RN, 01/07/23, [phone]

## 2023-02-07 ENCOUNTER — Other Ambulatory Visit: Payer: Medicaid Other

## 2023-02-07 ENCOUNTER — Ambulatory Visit (INDEPENDENT_AMBULATORY_CARE_PROVIDER_SITE_OTHER): Payer: Medicaid Other | Admitting: Obstetrics

## 2023-02-07 ENCOUNTER — Encounter: Payer: Self-pay | Admitting: Obstetrics

## 2023-02-07 DIAGNOSIS — Z3042 Encounter for surveillance of injectable contraceptive: Secondary | ICD-10-CM | POA: Diagnosis not present

## 2023-02-07 MED ORDER — MEDROXYPROGESTERONE ACETATE 150 MG/ML IM SUSP
150.0000 mg | Freq: Once | INTRAMUSCULAR | Status: DC
Start: 2023-02-07 — End: 2023-11-01

## 2023-02-07 MED ORDER — MEDROXYPROGESTERONE ACETATE 150 MG/ML IM SUSP
150.0000 mg | INTRAMUSCULAR | 4 refills | Status: DC
Start: 2023-02-07 — End: 2023-11-01

## 2023-02-07 NOTE — Progress Notes (Signed)
Post Partum Visit Note  Veronica Bradley is a 32 y.o. [phone] female who presents for a postpartum visit. She is 6 weeks postpartum following a normal spontaneous vaginal delivery.  I have fully reviewed the prenatal and intrapartum course. The delivery was at 37 gestational weeks.  Anesthesia: epidural. Postpartum course has been good. Baby is doing well. Baby is feeding by breast. Bleeding no bleeding. Bowel function is normal. Bladder function is normal. Patient is sexually active. Contraception method is Depo-Provera injections. Postpartum depression screening: negative.   The pregnancy intention screening data noted above was reviewed. Potential methods of contraception were discussed. The patient elected to proceed with No data recorded.   Edinburgh Postnatal Depression Scale - 02/07/23 0924       Edinburgh Postnatal Depression Scale:  In the Past 7 Days   I have been able to laugh and see the funny side of things. 0    I have looked forward with enjoyment to things. 0    I have blamed myself unnecessarily when things went wrong. 0    I have been anxious or worried for no good reason. 1    I have felt scared or panicky for no good reason. 0    Things have been getting on top of me. 1    I have been so unhappy that I have had difficulty sleeping. 0    I have felt sad or miserable. 0    I have been so unhappy that I have been crying. 0    The thought of harming myself has occurred to me. 0    Edinburgh Postnatal Depression Scale Total 2             Health Maintenance Due  Topic Date Due   COVID-19 Vaccine (1) Never done    The following portions of the patient's history were reviewed and updated as appropriate: allergies, current medications, past family history, past medical history, past social history, past surgical history, and problem list.  Review of Systems A comprehensive review of systems was negative.  Objective:  BP 115/79   Pulse 65   Ht 5\' 1"  (1.549 m)    Wt 181 lb 8 oz (82.3 kg)   LMP 04/07/2022 (Approximate)   Breastfeeding Yes   BMI 34.29 kg/m    General:  alert and no distress   Breasts:  normal  Lungs: clear to auscultation bilaterally  Heart:  regular rate and rhythm, S1, S2 normal, no murmur, click, rub or gallop  Abdomen: soft, non-tender; bowel sounds normal; no masses,  no organomegaly   Wound none  GU exam:  not indicated       Assessment:    1. Postpartum care following vaginal delivery - doing well  2. Encounter for surveillance of injectable contraceptive Rx: - medroxyPROGESTERone (DEPO-PROVERA) injection 150 mg - medroxyPROGESTERone (DEPO-PROVERA) 150 MG/ML injection; Inject 1 mL (150 mg total) into the muscle every 3 (three) months.  Dispense: 1 mL; Refill: 4   Plan:   Essential components of care per ACOG recommendations:  1.  Mood and well being: Patient with negative depression screening today. Reviewed local resources for support.  - Patient tobacco use? No.   - hx of drug use? No.    2. Infant care and feeding:  -Patient currently breastmilk feeding? Yes. Discussed returning to work and pumping. Reviewed importance of draining breast regularly to support lactation.  -Social determinants of health (SDOH) reviewed in EPIC. No concerns.  3. Sexuality, contraception and  birth spacing - Patient does not want a pregnancy in the next year.  Desired family size is 3 children.  - Reviewed reproductive life planning. Reviewed contraceptive methods based on pt preferences and effectiveness.  Patient desired Hormonal Injection today.   - Discussed birth spacing of 18 months  4. Sleep and fatigue -Encouraged family/partner/community support of 4 hrs of uninterrupted sleep to help with mood and fatigue  5. Physical Recovery  - Discussed patients delivery and complications. She describes her labor as good. - Patient had a Vaginal, no problems at delivery. Patient had  no  laceration. Perineal healing reviewed.  Patient expressed understanding - Patient has urinary incontinence? No. - Patient is safe to resume physical and sexual activity  6.  Health Maintenance - HM due items addressed Yes - Last pap smear  Diagnosis  Date Value Ref Range Status  07/16/2022   Final   - Negative for intraepithelial lesion or malignancy (NILM)   Pap smear not done at today's visit.  -Breast Cancer screening indicated? No.   7. Chronic Disease/Pregnancy Condition follow up: None   Coral Ceo, MD Center for [facility], [facility], [place] 02/07/23

## 2023-02-11 ENCOUNTER — Other Ambulatory Visit: Payer: Medicaid Other

## 2023-02-19 ENCOUNTER — Other Ambulatory Visit: Payer: Medicaid Other

## 2023-03-07 ENCOUNTER — Other Ambulatory Visit: Payer: Medicaid Other

## 2023-03-08 ENCOUNTER — Other Ambulatory Visit: Payer: Self-pay | Admitting: Obstetrics and Gynecology

## 2023-03-19 ENCOUNTER — Other Ambulatory Visit: Payer: Medicaid Other

## 2023-03-22 ENCOUNTER — Other Ambulatory Visit: Payer: Medicaid Other

## 2023-03-27 ENCOUNTER — Ambulatory Visit: Payer: Medicaid Other

## 2023-03-27 ENCOUNTER — Other Ambulatory Visit: Payer: Medicaid Other

## 2023-04-02 ENCOUNTER — Ambulatory Visit: Payer: Medicaid Other

## 2023-04-02 ENCOUNTER — Other Ambulatory Visit: Payer: Medicaid Other

## 2023-04-05 ENCOUNTER — Other Ambulatory Visit (HOSPITAL_COMMUNITY)
Admission: RE | Admit: 2023-04-05 | Discharge: 2023-04-05 | Disposition: A | Discharge status: Home / Self Care | Payer: Medicaid Other | Source: Ambulatory Visit | Attending: Obstetrics and Gynecology | Admitting: Obstetrics and Gynecology

## 2023-04-05 ENCOUNTER — Other Ambulatory Visit: Payer: Medicaid Other

## 2023-04-05 ENCOUNTER — Ambulatory Visit: Payer: Medicaid Other

## 2023-04-05 DIAGNOSIS — O24410 Gestational diabetes mellitus in pregnancy, diet controlled: Secondary | ICD-10-CM

## 2023-04-05 DIAGNOSIS — Z3042 Encounter for surveillance of injectable contraceptive: Secondary | ICD-10-CM

## 2023-04-05 DIAGNOSIS — Z3202 Encounter for pregnancy test, result negative: Secondary | ICD-10-CM | POA: Diagnosis not present

## 2023-04-05 DIAGNOSIS — N898 Other specified noninflammatory disorders of vagina: Secondary | ICD-10-CM | POA: Insufficient documentation

## 2023-04-05 LAB — POCT URINALYSIS DIPSTICK
Bilirubin, UA: NEGATIVE
Blood, UA: NEGATIVE
Glucose, UA: NEGATIVE
Ketones, UA: NEGATIVE
Leukocytes, UA: NEGATIVE
Nitrite, UA: NEGATIVE
Protein, UA: NEGATIVE
Spec Grav, UA: >=1.03 — AB (ref 1.010–1.025)
Urobilinogen, UA: 0.2 E.U./dL
pH, UA: 5 (ref 5.0–8.0)

## 2023-04-05 LAB — POCT URINE PREGNANCY: Preg Test, Ur: NEGATIVE

## 2023-04-05 MED ORDER — MEDROXYPROGESTERONE ACETATE 150 MG/ML IM SUSP
150.0000 mg | Freq: Once | INTRAMUSCULAR | Status: AC
Start: 2023-04-05 — End: 2023-04-05
  Administered 2023-04-05: 150 mg via INTRAMUSCULAR

## 2023-04-05 NOTE — Progress Notes (Addendum)
SUBJECTIVE:  32 y.o. female complains of white vaginal discharge for 7 day(s). Denies abnormal vaginal bleeding or significant pelvic pain or fever. No UTI symptoms. Denies history of known exposure to STD.  No LMP recorded.  OBJECTIVE:  She appears well, afebrile. Urine dipstick: negative for all components.  ASSESSMENT:  Vaginal Discharge  Vaginal Itching   PLAN:  GC, chlamydia, trichomonas, BVAG, CVAG probe sent to lab. Treatment: To be determined once lab results are received ROV prn if symptoms persist or worsen.    ----------------------------------------------------------------------  Date last PAP: 07/16/2022. Last Depo-Provera: 12/28/2022. Side Effects if any: NA. Serum HCG indicated? NONE. Depo-Provera 150 mg IM given by: Georgana Curio, RMA.  Office supplied DEPO Injection given in LUOG, tolerated well.  Next appointment due November 1-15, 2024.    Administrations This Visit     medroxyPROGESTERone (DEPO-PROVERA) injection 150 mg     Admin Date 04/05/2023 Action Given Dose 150 mg Route Intramuscular Documented By Maretta Bees, RMA

## 2023-04-06 LAB — GLUCOSE TOLERANCE, 2 HOURS
Glucose, 2 hour: 99 mg/dL (ref 70–139)
Glucose, GTT - Fasting: 85 mg/dL (ref 70–99)

## 2023-04-08 LAB — CERVICOVAGINAL ANCILLARY ONLY
Bacterial Vaginitis (gardnerella): NEGATIVE
Candida Glabrata: NEGATIVE
Candida Vaginitis: POSITIVE — AB
Chlamydia: NEGATIVE
Comment: NEGATIVE
Comment: NEGATIVE
Comment: NEGATIVE
Comment: NEGATIVE
Comment: NEGATIVE
Comment: NORMAL
Neisseria Gonorrhea: NEGATIVE
Trichomonas: NEGATIVE

## 2023-04-09 ENCOUNTER — Telehealth: Payer: Self-pay

## 2023-04-09 ENCOUNTER — Other Ambulatory Visit: Payer: Self-pay

## 2023-04-09 DIAGNOSIS — B379 Candidiasis, unspecified: Secondary | ICD-10-CM

## 2023-04-09 LAB — URINE CULTURE

## 2023-04-09 MED ORDER — FLUCONAZOLE 150 MG PO TABS
150.0000 mg | ORAL_TABLET | Freq: Once | ORAL | 0 refills | Status: AC
Start: 2023-04-09 — End: 2023-04-09

## 2023-04-09 NOTE — Telephone Encounter (Signed)
Results given, Rx sent.

## 2023-04-09 NOTE — Telephone Encounter (Signed)
-----   Message from Hermina Staggers sent at 04/09/2023 10:05 AM EDT ----- Please let pt know that her vaginal swab was positive for yeast Send in Rx for Tx Thanks Casimiro Needle

## 2023-04-10 ENCOUNTER — Telehealth: Payer: Self-pay

## 2023-04-10 ENCOUNTER — Other Ambulatory Visit: Payer: Self-pay

## 2023-04-10 DIAGNOSIS — N390 Urinary tract infection, site not specified: Secondary | ICD-10-CM

## 2023-04-10 MED ORDER — CEPHALEXIN 500 MG PO CAPS
500.0000 mg | ORAL_CAPSULE | Freq: Three times a day (TID) | ORAL | 0 refills | Status: DC
Start: 2023-04-10 — End: 2023-11-01

## 2023-04-10 NOTE — Telephone Encounter (Signed)
Pt informed of urine culture results. Rx sent

## 2023-04-10 NOTE — Telephone Encounter (Signed)
-----   Message from Hermina Staggers sent at 04/09/2023  3:10 PM EDT ----- Please let pt know that she has a UTI Send in Keflex 500 mg po tid x 7 days Thanks Casimiro Needle

## 2023-05-14 ENCOUNTER — Ambulatory Visit: Payer: Medicaid Other

## 2023-05-16 ENCOUNTER — Ambulatory Visit (INDEPENDENT_AMBULATORY_CARE_PROVIDER_SITE_OTHER): Payer: Medicaid Other | Admitting: Emergency Medicine

## 2023-05-16 ENCOUNTER — Other Ambulatory Visit (HOSPITAL_COMMUNITY)
Admission: RE | Admit: 2023-05-16 | Discharge: 2023-05-16 | Disposition: A | Discharge status: Home / Self Care | Payer: Medicaid Other | Source: Ambulatory Visit | Attending: Obstetrics & Gynecology | Admitting: Obstetrics & Gynecology

## 2023-05-16 VITALS — BP 118/80 | HR 73 | Wt 186.2 lb

## 2023-05-16 DIAGNOSIS — Z87440 Personal history of urinary (tract) infections: Secondary | ICD-10-CM

## 2023-05-16 DIAGNOSIS — Z113 Encounter for screening for infections with a predominantly sexual mode of transmission: Secondary | ICD-10-CM | POA: Diagnosis present

## 2023-05-16 LAB — POCT URINALYSIS DIPSTICK
Bilirubin, UA: NEGATIVE
Blood, UA: NEGATIVE
Glucose, UA: NEGATIVE
Ketones, UA: NEGATIVE
Nitrite, UA: POSITIVE
Protein, UA: NEGATIVE
Spec Grav, UA: 1.015 (ref 1.010–1.025)
Urobilinogen, UA: 0.2 E.U./dL
pH, UA: 6 (ref 5.0–8.0)

## 2023-05-16 NOTE — Progress Notes (Signed)
Pt presents for TOC for UTI. Reports odor from urine but denies dysuria, urinary frequency, urgency or pelvic pain.  Also requests vaginal swab for STI testing.

## 2023-05-17 LAB — CERVICOVAGINAL ANCILLARY ONLY
Bacterial Vaginitis (gardnerella): NEGATIVE
Candida Glabrata: NEGATIVE
Candida Vaginitis: POSITIVE — AB
Chlamydia: NEGATIVE
Comment: NEGATIVE
Comment: NEGATIVE
Comment: NEGATIVE
Comment: NEGATIVE
Comment: NEGATIVE
Comment: NORMAL
Neisseria Gonorrhea: NEGATIVE
Trichomonas: NEGATIVE

## 2023-05-20 LAB — URINE CULTURE

## 2023-05-22 ENCOUNTER — Other Ambulatory Visit: Payer: Self-pay

## 2023-05-22 DIAGNOSIS — B379 Candidiasis, unspecified: Secondary | ICD-10-CM

## 2023-05-22 DIAGNOSIS — N390 Urinary tract infection, site not specified: Secondary | ICD-10-CM

## 2023-05-22 MED ORDER — NITROFURANTOIN MONOHYD MACRO 100 MG PO CAPS
100.0000 mg | ORAL_CAPSULE | Freq: Two times a day (BID) | ORAL | 1 refills | Status: DC
Start: 2023-05-22 — End: 2023-11-01

## 2023-05-22 MED ORDER — FLUCONAZOLE 150 MG PO TABS
150.0000 mg | ORAL_TABLET | Freq: Once | ORAL | 0 refills | Status: AC
Start: 2023-05-22 — End: 2023-05-22

## 2023-06-28 ENCOUNTER — Ambulatory Visit: Payer: Medicaid Other

## 2023-07-23 ENCOUNTER — Other Ambulatory Visit (HOSPITAL_COMMUNITY)
Admission: RE | Admit: 2023-07-23 | Discharge: 2023-07-23 | Disposition: A | Discharge status: Home / Self Care | Payer: Medicaid Other | Source: Ambulatory Visit | Attending: Obstetrics and Gynecology | Admitting: Obstetrics and Gynecology

## 2023-07-23 ENCOUNTER — Ambulatory Visit: Payer: Medicaid Other

## 2023-07-23 VITALS — Wt 191.6 lb

## 2023-07-23 DIAGNOSIS — Z3202 Encounter for pregnancy test, result negative: Secondary | ICD-10-CM

## 2023-07-23 DIAGNOSIS — N898 Other specified noninflammatory disorders of vagina: Secondary | ICD-10-CM

## 2023-07-23 DIAGNOSIS — Z3200 Encounter for pregnancy test, result unknown: Secondary | ICD-10-CM

## 2023-07-23 DIAGNOSIS — Z3480 Encounter for supervision of other normal pregnancy, unspecified trimester: Secondary | ICD-10-CM

## 2023-07-23 LAB — POCT URINE PREGNANCY: Preg Test, Ur: NEGATIVE

## 2023-07-23 MED ORDER — PRENATAL 28-0.8 MG PO TABS: 1.0000 | ORAL_TABLET | Freq: Every day | ORAL | 12 refills | Status: DC

## 2023-07-23 NOTE — Progress Notes (Signed)
..  Ms. Shytle presents today for UPT. She has no unusual complaints. Pt has been on depo and missed last injection. She states that she had 1 positive pregnancy test last week and 4 negative tests. Pt also requests self swab for vaginal discharge and itching x1 week.  LMP: Reports No cycle since delivering baby on 12/27/22.      OBJECTIVE: Appears well, in no apparent distress.  OB History     Gravida  8   Para  3   Term  2   Preterm  1   AB  5   Living  3      SAB  0   IAB  5   Ectopic  0   Multiple  0   Live Births  3        Obstetric Comments  Second baby was 7#        Home UPT Result: 1 Positive, 4 Negatives In-Office UPT result: 2 Negatives I have reviewed the patient's medical, obstetrical, social, and family histories, and medications.   ASSESSMENT: Negative pregnancy test.  Recently on Depo but missed last injection Vaginal discharge and itching  PLAN Advised pt to monitor symptoms and return in 2 weeks for another UPT, if results are negative depo can be restarted, pt agreed.  Pt performed self swab, sent to lab, results pending.

## 2023-07-24 LAB — CERVICOVAGINAL ANCILLARY ONLY
Bacterial Vaginitis (gardnerella): NEGATIVE
Candida Glabrata: NEGATIVE
Candida Vaginitis: POSITIVE — AB
Chlamydia: NEGATIVE
Comment: NEGATIVE
Comment: NEGATIVE
Comment: NEGATIVE
Comment: NEGATIVE
Comment: NEGATIVE
Comment: NORMAL
Neisseria Gonorrhea: NEGATIVE
Trichomonas: NEGATIVE

## 2023-07-25 ENCOUNTER — Other Ambulatory Visit: Payer: Self-pay | Admitting: *Deleted

## 2023-07-25 MED ORDER — FLUCONAZOLE 150 MG PO TABS: 150.0000 mg | ORAL_TABLET | Freq: Once | ORAL | 0 refills | Status: DC

## 2023-07-25 NOTE — Progress Notes (Signed)
Diflucan sent for +yeast  See lab result

## 2023-09-11 ENCOUNTER — Other Ambulatory Visit (HOSPITAL_COMMUNITY)
Admission: RE | Admit: 2023-09-11 | Discharge: 2023-09-11 | Disposition: A | Discharge status: Home / Self Care | Payer: Medicaid Other | Source: Ambulatory Visit | Attending: Obstetrics & Gynecology | Admitting: Obstetrics & Gynecology

## 2023-09-11 ENCOUNTER — Ambulatory Visit (INDEPENDENT_AMBULATORY_CARE_PROVIDER_SITE_OTHER): Payer: Medicaid Other

## 2023-09-11 VITALS — BP 126/84 | HR 75 | Ht 61.0 in | Wt 194.0 lb

## 2023-09-11 DIAGNOSIS — N898 Other specified noninflammatory disorders of vagina: Secondary | ICD-10-CM | POA: Insufficient documentation

## 2023-09-11 NOTE — Progress Notes (Signed)
SUBJECTIVE:  33 y.o. female complains of copious and foul vaginal discharge for 7  day(s). Denies abnormal vaginal bleeding or significant pelvic pain or fever. No UTI symptoms. Denies history of known exposure to STD.  No LMP recorded. Patient has had an injection.  OBJECTIVE:  She appears well, afebrile. Urine dipstick: not done.  ASSESSMENT:  Vaginal Discharge  Vaginal Odor Vaginal Itching   PLAN:  GC, chlamydia, trichomonas, BVAG, CVAG probe sent to lab. Treatment: To be determined once lab results are received ROV prn if symptoms persist or worsen.

## 2023-09-12 LAB — CERVICOVAGINAL ANCILLARY ONLY
Bacterial Vaginitis (gardnerella): NEGATIVE
Candida Glabrata: NEGATIVE
Candida Vaginitis: POSITIVE — AB
Chlamydia: NEGATIVE
Comment: NEGATIVE
Comment: NEGATIVE
Comment: NEGATIVE
Comment: NEGATIVE
Comment: NEGATIVE
Comment: NORMAL
Neisseria Gonorrhea: NEGATIVE
Trichomonas: NEGATIVE

## 2023-09-13 ENCOUNTER — Other Ambulatory Visit: Payer: Self-pay

## 2023-09-13 MED ORDER — FLUCONAZOLE 150 MG PO TABS: 150.0000 mg | ORAL_TABLET | Freq: Once | ORAL | 0 refills | Status: AC

## 2023-09-13 NOTE — Progress Notes (Signed)
Returned call, pt requesting treatment for yeast after +swab on 09/11/23. Sent per protocol

## 2023-09-25 ENCOUNTER — Ambulatory Visit: Payer: Medicaid Other | Admitting: Obstetrics & Gynecology

## 2023-11-01 ENCOUNTER — Ambulatory Visit: Payer: Medicaid Other | Admitting: Obstetrics & Gynecology

## 2023-11-01 ENCOUNTER — Encounter: Payer: Self-pay | Admitting: Obstetrics & Gynecology

## 2023-11-01 VITALS — BP 119/67 | HR 82 | Ht 61.0 in | Wt 204.0 lb

## 2023-11-01 DIAGNOSIS — Z01419 Encounter for gynecological examination (general) (routine) without abnormal findings: Secondary | ICD-10-CM

## 2023-11-01 DIAGNOSIS — N939 Abnormal uterine and vaginal bleeding, unspecified: Secondary | ICD-10-CM

## 2023-11-01 DIAGNOSIS — Z30011 Encounter for initial prescription of contraceptive pills: Secondary | ICD-10-CM

## 2023-11-01 MED ORDER — NORETHIN ACE-ETH ESTRAD-FE 1-20 MG-MCG(24) PO TABS: 1.0000 | ORAL_TABLET | Freq: Every day | ORAL | 12 refills | Status: AC

## 2023-11-01 NOTE — Progress Notes (Signed)
 GYNECOLOGY CLINIC ANNUAL PREVENTATIVE CARE ENCOUNTER NOTE  Subjective:   Veronica Bradley is a 33 y.o. [phone] female here for a routine annual gynecologic exam.  Current complaints: Needs contraception.   Denies abnormal vaginal bleeding, discharge, pelvic pain, problems with intercourse or other gynecologic concerns. She stopped depo provera due to weight gain and would like to use OCP   Gynecologic History Patient's last menstrual period was 10/23/2023. Contraception:  start OCP Last Pap: 2023. Results were: normal   Obstetric History OB History  Gravida Para Term Preterm AB Living  8 3 2 1 5 3   SAB IAB Ectopic Multiple Live Births  0 5 0 0 3    # Outcome Date GA Lbr Len/2nd Weight Sex Type Anes PTL Lv  8 Term 12/27/22 [redacted]w[redacted]d 15:46 / 00:27 6 lb 10.2 oz (3.01 kg) M Vag-Spont EPI  LIV  7 Preterm 08/14/12    F CS-LTranv   LIV  6 Term 2010 [redacted]w[redacted]d  7 lb 6 oz (3.345 kg) F Vag-Vacuum EPI       Birth Comments: multiple vaccums, shoulder dystocia, baby intubated.   5 IAB 2007          4 IAB 2007          3 IAB 2006          2 IAB           1 IAB             Obstetric Comments  Second baby was 7#    Past Medical History:  Diagnosis Date   Depression    post partum   Eczema    Gestational diabetes    Other allergic rhinitis 07/26/2021   Other atopic dermatitis 07/26/2021   Prediabetes    Seasonal allergies     Past Surgical History:  Procedure Laterality Date   brazillian butt lift  2020   CESAREAN SECTION     DILATION AND CURETTAGE OF UTERUS     X5   LIPOSUCTION     removed from abd and back- infected into hips and butt   WISDOM TOOTH EXTRACTION      Current Outpatient Medications on File Prior to Visit  Medication Sig Dispense Refill   EPINEPHrine 0.3 mg/0.3 mL IJ SOAJ injection Inject 0.3 mg into the muscle as needed for anaphylaxis. (Patient not taking: Reported on 11/01/2023) 1 each 1   No current facility-administered medications on file prior to visit.     Allergies  Allergen Reactions   Shellfish Allergy Itching    Social History   Socioeconomic History   Marital status: Married    Spouse name: Gardiner Barefoot   Number of children: Not on file   Years of education: Not on file   Highest education level: Not on file  Occupational History   Not on file  Tobacco Use   Smoking status: Never   Smokeless tobacco: Never  Vaping Use   Vaping status: Never Used  Substance and Sexual Activity   Alcohol use: Not Currently    Alcohol/week: 1.0 standard drink of alcohol    Types: 1 Glasses of wine per week   Drug use: Not Currently    Types: Marijuana    Comment: last use aug 2023   Sexual activity: Yes    Birth control/protection: None  Other Topics Concern   Not on file  Social History Narrative   Not on file   Social Drivers of Health   Financial Resource Strain: Not on file  Food Insecurity: No Food Insecurity (12/26/2022)   Hunger Vital Sign    Worried About Running Out of Food in the Last Year: Never true    Ran Out of Food in the Last Year: Never true  Transportation Needs: No Transportation Needs (12/26/2022)   PRAPARE - Administrator, Civil Service (Medical): No    Lack of Transportation (Non-Medical): No  Physical Activity: Not on file  Stress: Not on file  Social Connections: Not on file  Intimate Partner Violence: Not At Risk (12/26/2022)   Humiliation, Afraid, Rape, and Kick questionnaire    Fear of Current or Ex-Partner: No    Emotionally Abused: No    Physically Abused: No    Sexually Abused: No    Family History  Problem Relation Age of Onset   Diabetes Mother        pre-diabetic   Allergic rhinitis Mother    Eczema Father    Other Father        "shot in the head"   Hypertension Maternal Aunt    Diabetes Maternal Grandmother    Allergic rhinitis Maternal Grandmother    Kidney failure Maternal Grandmother    Diabetes Maternal Grandfather    Kidney failure Maternal Grandfather    Healthy  Paternal Grandfather    Asthma Neg Hx    Cancer Neg Hx    Heart disease Neg Hx     The following portions of the patient's history were reviewed and updated as appropriate: allergies, current medications, past family history, past medical history, past social history, past surgical history and problem list.  Review of Systems Pertinent items are noted in HPI.   Objective:  BP 119/67   Pulse 82   Ht 5\' 1"  (1.549 m)   Wt 204 lb (92.5 kg)   LMP 10/23/2023   Breastfeeding No   BMI 38.55 kg/m  CONSTITUTIONAL: Well-developed, well-nourished female in no acute distress.  HENT:  Normocephalic, atraumatic, External right and left ear normal. EYES: Conjunctivae and EOM are normal. Pupils are equal, round, and reactive to light. No scleral icterus.  NECK: Normal range of motion, supple SKIN: Skin is warm and dry. No rash noted. Not diaphoretic. No erythema. No pallor. NEUROLGIC: Alert and oriented to person, place, and time. Normal reflexes, muscle tone coordination. No cranial nerve deficit noted. PSYCHIATRIC: Normal mood and affect. Normal behavior. Normal judgment and thought content. CARDIOVASCULAR: Normal heart rate noted, regular rhythm RESPIRATORY:Effort and breath sounds normal, no problems with respiration noted. BREASTS: deferred ABDOMEN: Soft, normal bowel sounds, no distention noted.  No tenderness, rebound or guarding.  PELVIC: not indicated, pap up to date MUSCULOSKELETAL: Normal range of motion.  Assessment:  Annual gynecologic examination  Vaginal spotting - Plan: POCT urine pregnancy  Well woman exam with routine gynecological exam  Oral contraception initial prescription - Plan: Norethindrone Acetate-Ethinyl Estrad-FE (LOESTRIN 24 FE) 1-20 MG-MCG(24) tablet  Plan:  Routine preventative health maintenance measures emphasized. Please refer to After Visit Summary for other counseling recommendations.    Scheryl Darter, MD Attending Obstetrician & Gynecologist Center  for [employer], [facility]

## 2023-12-26 ENCOUNTER — Ambulatory Visit

## 2024-02-17 ENCOUNTER — Ambulatory Visit

## 2024-03-30 ENCOUNTER — Ambulatory Visit

## 2024-03-31 ENCOUNTER — Ambulatory Visit (INDEPENDENT_AMBULATORY_CARE_PROVIDER_SITE_OTHER)

## 2024-03-31 ENCOUNTER — Other Ambulatory Visit (HOSPITAL_COMMUNITY)
Admission: RE | Admit: 2024-03-31 | Discharge: 2024-03-31 | Disposition: A | Discharge status: Home / Self Care | Source: Ambulatory Visit | Attending: Obstetrics and Gynecology | Admitting: Obstetrics and Gynecology

## 2024-03-31 VITALS — BP 109/76 | HR 67 | Wt 206.6 lb

## 2024-03-31 DIAGNOSIS — Z3202 Encounter for pregnancy test, result negative: Secondary | ICD-10-CM

## 2024-03-31 DIAGNOSIS — N898 Other specified noninflammatory disorders of vagina: Secondary | ICD-10-CM

## 2024-03-31 DIAGNOSIS — R300 Dysuria: Secondary | ICD-10-CM

## 2024-03-31 DIAGNOSIS — Z3200 Encounter for pregnancy test, result unknown: Secondary | ICD-10-CM

## 2024-03-31 LAB — POCT URINALYSIS DIPSTICK
Bilirubin, UA: NEGATIVE
Blood, UA: NEGATIVE
Glucose, UA: NEGATIVE
Ketones, UA: NEGATIVE
Leukocytes, UA: NEGATIVE
Nitrite, UA: NEGATIVE
Protein, UA: NEGATIVE
Spec Grav, UA: 1.01 (ref 1.010–1.025)
Urobilinogen, UA: 0.2 U/dL
pH, UA: 7 (ref 5.0–8.0)

## 2024-03-31 LAB — POCT URINE PREGNANCY: Preg Test, Ur: NEGATIVE

## 2024-03-31 NOTE — Progress Notes (Signed)
 SUBJECTIVE:  33 y.o. female complains of white vaginal discharge for 2 week(s). Denies abnormal vaginal bleeding or significant pelvic pain or fever. Doesn't feel as if she is emptying her bladder completely. Denies history of known exposure to STD.  No LMP recorded. Patient has had an injection.  OBJECTIVE:  She appears well, afebrile. Urine dipstick:  ASSESSMENT:  Vaginal Discharge  Vaginal Odor   PLAN:  GC, chlamydia, trichomonas, BVAG, CVAG probe sent to lab. Treatment: To be determined once lab results are received ROV prn if symptoms persist or worsen.

## 2024-04-01 ENCOUNTER — Ambulatory Visit: Payer: Self-pay | Admitting: Obstetrics and Gynecology

## 2024-04-01 LAB — CERVICOVAGINAL ANCILLARY ONLY
Bacterial Vaginitis (gardnerella): NEGATIVE
Candida Glabrata: NEGATIVE
Candida Vaginitis: POSITIVE — AB
Chlamydia: NEGATIVE
Comment: NEGATIVE
Comment: NEGATIVE
Comment: NEGATIVE
Comment: NEGATIVE
Comment: NEGATIVE
Comment: NORMAL
Neisseria Gonorrhea: NEGATIVE
Trichomonas: NEGATIVE

## 2024-04-02 LAB — URINE CULTURE

## 2024-04-07 ENCOUNTER — Other Ambulatory Visit: Payer: Self-pay

## 2024-04-07 DIAGNOSIS — B379 Candidiasis, unspecified: Secondary | ICD-10-CM

## 2024-04-07 MED ORDER — FLUCONAZOLE 150 MG PO TABS: 150.0000 mg | ORAL_TABLET | Freq: Once | ORAL | 0 refills | Status: AC

## 2024-05-21 ENCOUNTER — Ambulatory Visit
Admission: RE | Admit: 2024-05-21 | Discharge: 2024-05-21 | Disposition: A | Discharge status: Home / Self Care | Source: Ambulatory Visit | Attending: Family Medicine | Admitting: Family Medicine

## 2024-05-21 ENCOUNTER — Ambulatory Visit: Admitting: Obstetrics and Gynecology

## 2024-05-21 ENCOUNTER — Ambulatory Visit (INDEPENDENT_AMBULATORY_CARE_PROVIDER_SITE_OTHER): Admitting: Obstetrics and Gynecology

## 2024-05-21 ENCOUNTER — Other Ambulatory Visit (HOSPITAL_COMMUNITY)
Admission: RE | Admit: 2024-05-21 | Discharge: 2024-05-21 | Disposition: A | Discharge status: Home / Self Care | Source: Ambulatory Visit | Attending: Obstetrics and Gynecology | Admitting: Obstetrics and Gynecology

## 2024-05-21 ENCOUNTER — Encounter: Payer: Self-pay | Admitting: Obstetrics and Gynecology

## 2024-05-21 VITALS — BP 124/81 | HR 71 | Ht 61.0 in | Wt 197.8 lb

## 2024-05-21 VITALS — BP 125/80 | HR 80 | Temp 98.3°F | Resp 16

## 2024-05-21 DIAGNOSIS — S300XXA Contusion of lower back and pelvis, initial encounter: Secondary | ICD-10-CM

## 2024-05-21 DIAGNOSIS — Z113 Encounter for screening for infections with a predominantly sexual mode of transmission: Secondary | ICD-10-CM | POA: Diagnosis present

## 2024-05-21 DIAGNOSIS — S39012A Strain of muscle, fascia and tendon of lower back, initial encounter: Secondary | ICD-10-CM | POA: Diagnosis not present

## 2024-05-21 DIAGNOSIS — S93401A Sprain of unspecified ligament of right ankle, initial encounter: Secondary | ICD-10-CM | POA: Diagnosis not present

## 2024-05-21 MED ORDER — CYCLOBENZAPRINE HCL 5 MG PO TABS: 5.0000 mg | ORAL_TABLET | Freq: Every evening | ORAL | 0 refills | Status: AC | PRN

## 2024-05-21 MED ORDER — CELECOXIB 200 MG PO CAPS: 200.0000 mg | ORAL_CAPSULE | Freq: Two times a day (BID) | ORAL | 0 refills | Status: AC

## 2024-05-21 NOTE — Progress Notes (Signed)
 Patient seen for nurse visit. Requested STI testing only.

## 2024-05-21 NOTE — ED Triage Notes (Signed)
 Pt states she fell in a hole 9/27-pain to lower back and right ankle-states [place] was [place] state-xrays were done and advised to f/u with PT-taking tylenol -NAD-slow gait

## 2024-05-21 NOTE — ED Provider Notes (Signed)
 [facility]  Note:  This document was prepared using Conservation officer, historic buildings and may include unintentional dictation errors.  MRN: [id number] DOB: 1990/10/09  Subjective:   Veronica Bradley is a 33 y.o. female presenting for 5 day history of persistent right-sided low back pain right ankle pain, right foot pain, right-sided upper back pain.  Symptoms started after she suffered a fall from a faulty step on 05/16/2024.  Patient was seen in a different state and an emergency room.  Had imaging done that was negative.  Was advised to follow-up for physical therapy.  Per patient, her employer is not going to allow her to return to work without completing physical therapy and documentation stating that she can return to work.  Patient has been using Tylenol  without relief.  No current facility-administered medications for this encounter.  Current Outpatient Medications:    EPINEPHrine  0.3 mg/0.3 mL IJ SOAJ injection, Inject 0.3 mg into the muscle as needed for anaphylaxis. (Patient not taking: Reported on 05/21/2024), Disp: 1 each, Rfl: 1   Norethindrone Acetate-Ethinyl Estrad-FE (LOESTRIN 24 FE) 1-20 MG-MCG(24) tablet, Take 1 tablet by mouth daily. (Patient not taking: Reported on 05/21/2024), Disp: 28 tablet, Rfl: 12   Prenatal Vit-Fe Fumarate-FA (MULTIVITAMIN-PRENATAL) 27-0.8 MG TABS tablet, Take 1 tablet by mouth daily at 12 noon., Disp: , Rfl:    Allergies  Allergen Reactions   Shellfish Allergy Itching    Past Medical History:  Diagnosis Date   Depression    post partum   Eczema    Gestational diabetes    Other allergic rhinitis 07/26/2021   Other atopic dermatitis 07/26/2021   Prediabetes    Seasonal allergies      Past Surgical History:  Procedure Laterality Date   brazillian butt lift  2020   CESAREAN SECTION     DILATION AND CURETTAGE OF UTERUS     X5   LIPOSUCTION     removed from abd and back- infected into hips and butt   WISDOM TOOTH  EXTRACTION      Family History  Problem Relation Age of Onset   Diabetes Mother        pre-diabetic   Allergic rhinitis Mother    Eczema Father    Other Father        shot in the head   Hypertension Maternal Aunt    Diabetes Maternal Grandmother    Allergic rhinitis Maternal Grandmother    Kidney failure Maternal Grandmother    Diabetes Maternal Grandfather    Kidney failure Maternal Grandfather    Healthy Paternal Grandfather    Asthma Neg Hx    Cancer Neg Hx    Heart disease Neg Hx     Social History   Tobacco Use   Smoking status: Never   Smokeless tobacco: Never  Vaping Use   Vaping status: Never Used  Substance Use Topics   Alcohol use: Yes    Comment: occ   Drug use: Not Currently    Types: Marijuana    ROS   Objective:   Vitals: BP 125/80 (BP Location: Left Arm)   Pulse 80   Temp 98.3 F (36.8 C) (Oral)   Resp 16   LMP 05/11/2024   SpO2 98%   Physical Exam Constitutional:      General: She is not in acute distress.    Appearance: Normal appearance. She is well-developed. She is not ill-appearing, toxic-appearing or diaphoretic.  HENT:     Head: Normocephalic and atraumatic.  Nose: Nose normal.     Mouth/Throat:     Mouth: Mucous membranes are moist.  Eyes:     General: No scleral icterus.       Right eye: No discharge.        Left eye: No discharge.     Extraocular Movements: Extraocular movements intact.  Cardiovascular:     Rate and Rhythm: Normal rate.  Pulmonary:     Effort: Pulmonary effort is normal.  Musculoskeletal:     Lumbar back: Spasms and tenderness present. No swelling, edema, deformity, signs of trauma, lacerations or bony tenderness. Normal range of motion. Positive right straight leg raise test. Negative left straight leg raise test. No scoliosis.       Back:     Right ankle: Swelling present. No deformity, ecchymosis or lacerations. Tenderness present over the lateral malleolus, ATF ligament and AITF ligament.   Skin:    General: Skin is warm and dry.  Neurological:     General: No focal deficit present.     Mental Status: She is alert and oriented to person, place, and time.  Psychiatric:        Mood and Affect: Mood normal.        Behavior: Behavior normal.    Right ankle wrapped using 4 Ace wrap in figure-8 method.   Assessment and Plan :   PDMP not reviewed this encounter.  1. Lumbar strain, initial encounter   2. Contusion of lower back, initial encounter   3. Sprain of right ankle, unspecified ligament, initial encounter    Recommend management of her lumbar strain, contusion, right ankle sprain with celecoxib, general RICE method.  Follow-up with an orthopedic practice for a recheck, physical therapy. Counseled patient on potential for adverse effects with medications prescribed/recommended today, ER and return-to-clinic precautions discussed, patient verbalized understanding.    Christopher Savannah, [place] 05/21/24 8161

## 2024-05-21 NOTE — Progress Notes (Signed)
 SUBJECTIVE:  33 y.o. female that is in office today for std screening - swab and labs. Denies abnormal vaginal bleeding or significant pelvic pain or fever. No UTI symptoms. Denies history of known exposure to STD.  Patient's last menstrual period was 05/11/2024.  OBJECTIVE:  She appears well, afebrile.   ASSESSMENT:  STD screening     PLAN:  GC, chlamydia, trichomonas, BVAG, CVAG probe sent to lab. Treatment: To be determined once lab results are received ROV prn if symptoms persist or worsen.

## 2024-05-22 ENCOUNTER — Emergency Department (HOSPITAL_COMMUNITY)
Admission: EM | Admit: 2024-05-22 | Discharge: 2024-05-22 | Disposition: A | Discharge status: Home / Self Care | Attending: Emergency Medicine | Admitting: Emergency Medicine

## 2024-05-22 ENCOUNTER — Ambulatory Visit: Payer: Self-pay | Admitting: Obstetrics and Gynecology

## 2024-05-22 DIAGNOSIS — W19XXXA Unspecified fall, initial encounter: Secondary | ICD-10-CM | POA: Insufficient documentation

## 2024-05-22 DIAGNOSIS — M545 Low back pain, unspecified: Secondary | ICD-10-CM | POA: Diagnosis present

## 2024-05-22 LAB — CERVICOVAGINAL ANCILLARY ONLY
Chlamydia: NEGATIVE
Comment: NEGATIVE
Comment: NEGATIVE
Comment: NORMAL
Neisseria Gonorrhea: NEGATIVE
Trichomonas: NEGATIVE

## 2024-05-22 LAB — HEPATITIS C ANTIBODY: Hep C Virus Ab: NONREACTIVE

## 2024-05-22 LAB — HEPATITIS B SURFACE ANTIGEN: Hepatitis B Surface Ag: NEGATIVE

## 2024-05-22 LAB — RPR: RPR Ser Ql: NONREACTIVE

## 2024-05-22 LAB — HIV ANTIBODY (ROUTINE TESTING W REFLEX): HIV Screen 4th Generation wRfx: NONREACTIVE

## 2024-05-22 NOTE — ED Provider Notes (Signed)
 [place] EMERGENCY DEPARTMENT AT [facility] Provider Note   CSN: [id number] Arrival date & time: 05/22/24  1042     Patient presents with: Referral   Fabiha Massaro is a 33 y.o. female.   33 year old female presents requesting referral to PT.  Patient seen at urgent care yesterday but was not given orthopedic referral.  States that she had a fall about 2 weeks ago and injured her lower back and ankle.  Has no other complaints at this time       Prior to Admission medications   Medication Sig Start Date End Date Taking? Authorizing Provider  celecoxib (CELEBREX) 200 MG capsule Take 1 capsule (200 mg total) by mouth 2 (two) times daily. 05/21/24   Christopher Savannah, PA-C  cyclobenzaprine (FLEXERIL) 5 MG tablet Take 1 tablet (5 mg total) by mouth at bedtime as needed. 05/21/24   Christopher Savannah, PA-C  EPINEPHrine  0.3 mg/0.3 mL IJ SOAJ injection Inject 0.3 mg into the muscle as needed for anaphylaxis. Patient not taking: Reported on 05/21/2024 07/26/21   Luke Orlan HERO, DO  Norethindrone Acetate-Ethinyl Estrad-FE (LOESTRIN 24 FE) 1-20 MG-MCG(24) tablet Take 1 tablet by mouth daily. Patient not taking: Reported on 05/21/2024 11/01/23   Eveline Lynwood MATSU, MD  Prenatal Vit-Fe Fumarate-FA (MULTIVITAMIN-PRENATAL) 27-0.8 MG TABS tablet Take 1 tablet by mouth daily at 12 noon.    [provider]    Allergies: Shellfish allergy    Review of Systems  All other systems reviewed and are negative.   Updated Vital Signs BP 124/68 (BP Location: Right Arm)   Pulse 73   Temp 98.4 F (36.9 C) (Oral)   Resp 16   LMP 05/11/2024   SpO2 99%   Physical Exam Vitals and nursing note reviewed.  Constitutional:      General: She is not in acute distress.    Appearance: Normal appearance. She is well-developed. She is not toxic-appearing.  HENT:     Head: Normocephalic and atraumatic.  Eyes:     General: Lids are normal.     Conjunctiva/sclera: Conjunctivae normal.     Pupils: Pupils are  equal, round, and reactive to light.  Neck:     Thyroid: No thyroid mass.     Trachea: No tracheal deviation.  Cardiovascular:     Rate and Rhythm: Normal rate and regular rhythm.     Heart sounds: Normal heart sounds. No murmur heard.    No gallop.  Pulmonary:     Effort: Pulmonary effort is normal. No respiratory distress.     Breath sounds: Normal breath sounds. No stridor. No decreased breath sounds, wheezing, rhonchi or rales.  Abdominal:     General: There is no distension.     Palpations: Abdomen is soft.     Tenderness: There is no abdominal tenderness. There is no rebound.  Musculoskeletal:        General: No tenderness. Normal range of motion.     Cervical back: Normal range of motion and neck supple.  Skin:    General: Skin is warm and dry.     Findings: No abrasion or rash.  Neurological:     Mental Status: She is alert and oriented to person, place, and time. Mental status is at baseline.     GCS: GCS eye subscore is 4. GCS verbal subscore is 5. GCS motor subscore is 6.     Cranial Nerves: No cranial nerve deficit.     Sensory: No sensory deficit.  Motor: Motor function is intact.  Psychiatric:        Attention and Perception: Attention normal.        Speech: Speech normal.        Behavior: Behavior normal.     (all labs ordered are listed, but only abnormal results are displayed) Labs Reviewed - No data to display  EKG: None  Radiology: No results found.   Procedures   Medications Ordered in the ED - No data to display                                  Medical Decision Making  Patient given orthopedic referral.  Has adequate pain medication at home     Final diagnoses:  None    ED Discharge Orders     None          Dasie Faden, MD 05/22/24 1126

## 2024-05-22 NOTE — ED Triage Notes (Signed)
 Patient is here for referral to physical therapy. Seen 3-4 times for this issue (see previous injury note) and recommended to follow up with orthopedics as on AVS summary. Patient called [facility] today and told to follow up with her PCP instead. PCP can't get her in until late next week. Patient needs PT in order to return to work.

## 2024-05-22 NOTE — Discharge Instructions (Signed)
 Follow-up with the orthopedist for possible referral to physical therapy

## 2024-05-25 NOTE — ED Notes (Signed)
 05/25/24 1030 pt called stating referral to ortho was not sent. Reviewed chart and resent to office again today. Pt aware.

## 2024-06-09 ENCOUNTER — Ambulatory Visit: Attending: Family Medicine

## 2024-06-09 DIAGNOSIS — M25571 Pain in right ankle and joints of right foot: Secondary | ICD-10-CM | POA: Insufficient documentation

## 2024-06-09 DIAGNOSIS — M6281 Muscle weakness (generalized): Secondary | ICD-10-CM | POA: Insufficient documentation

## 2024-06-09 DIAGNOSIS — M5417 Radiculopathy, lumbosacral region: Secondary | ICD-10-CM | POA: Insufficient documentation

## 2024-06-09 NOTE — Therapy (Signed)
 OUTPATIENT PHYSICAL THERAPY THORACOLUMBAR EVALUATION   Patient Name: Veronica Bradley MRN: [id number] DOB:[birth year], 33 y.o., female Today's Date: 06/09/2024  END OF SESSION:  PT End of Session - 06/09/24 1259     Visit Number 1    Date for Recertification  09/01/24    Authorization Type [place] Medicaid    PT Start Time 1300    PT Stop Time 1345    PT Time Calculation (min) 45 min          Past Medical History:  Diagnosis Date   Depression    post partum   Eczema    Gestational diabetes    Other allergic rhinitis 07/26/2021   Other atopic dermatitis 07/26/2021   Prediabetes    Seasonal allergies    Past Surgical History:  Procedure Laterality Date   brazillian butt lift  2020   CESAREAN SECTION     DILATION AND CURETTAGE OF UTERUS     X5   LIPOSUCTION     removed from abd and back- infected into hips and butt   WISDOM TOOTH EXTRACTION     Patient Active Problem List   Diagnosis Date Noted   GDM, class A2 09/13/2022   Prior cesarean delivery 08/17/2022   Alpha thalassemia silent carrier 08/05/2022   Carrier of spinal muscular atrophy 08/05/2022   Sickle cell trait 08/05/2022    PCP: No PCP  REFERRING PROVIDER: Delinda Davis  REFERRING DIAG:  S39.012A (ICD-10-CM) - Strain of muscle, fascia and tendon of lower back, initial encounter    Rationale for Evaluation and Treatment: Rehabilitation  THERAPY DIAG:  Radiculopathy, lumbosacral region  Muscle weakness (generalized)  Pain in right ankle and joints of right foot  ONSET DATE: 05/16/24  SUBJECTIVE:                                                                                                                                                                                           SUBJECTIVE STATEMENT: I am still hurting, it is in my ankle and my back on the right side. It all started from a fall. I slipped down steps outside and my ankle turned and I fell on the R side.   PERTINENT HISTORY:   Veronica Bradley is a 33 y.o. female presenting for 5 day history of persistent right-sided low back pain right ankle pain, right foot pain, right-sided upper back pain.  Symptoms started after she suffered a fall from a faulty step on 05/16/2024.  Patient was seen in a different state and an emergency room.  Had imaging done that was negative.  Was advised to follow-up for physical therapy.  Per  patient, her employer is not going to allow her to return to work without completing physical therapy and documentation stating that she can return to work.  Patient has been using Tylenol  without relief.  PAIN:  Are you having pain? Yes: NPRS scale: 8/10 Pain location: R ankle up leg and into back on the R side Pain description: some numbness and tingling, sometimes it is sharp, comes and goes  Aggravating factors: moving around and chasing the kids, lifting and not using the right mechanics  Relieving factors: the medicine helps but I don't really like taking it  PRECAUTIONS: None  RED FLAGS: None   WEIGHT BEARING RESTRICTIONS: No  FALLS:  Has patient fallen in last 6 months? Yes. Number of falls 1  LIVING ENVIRONMENT: Lives with: lives with their family Lives in: House/apartment Stairs: Yes: Internal: 20 steps; on right going up  OCCUPATION: [employer]- I stand in one spot for hours   PLOF: Independent  PATIENT GOALS: to get better and move better   NEXT MD VISIT:   OBJECTIVE:  Note: Objective measures were completed at Evaluation unless otherwise noted.  DIAGNOSTIC FINDINGS:  Negative images    COGNITION: Overall cognitive status: Within functional limits for tasks assessed     SENSATION: WFL  MUSCLE LENGTH: Very tight in hamstrings, glutes, piriformis, low back  PALPATION: TTP R glute  LUMBAR ROM:   AROM eval  Flexion 75% with pain  Extension 50%  Right lateral flexion WFL  Left lateral flexion 50% with pain  Right rotation 75%  Left rotation 75%    (Blank rows  = not tested)  LOWER EXTREMITY ROM:   WFL, some pain with ankle PF and leg extension on RLE   LOWER EXTREMITY MMT:    MMT Right eval Left eval  Hip flexion 4-   Hip extension    Hip abduction    Hip adduction    Hip internal rotation    Hip external rotation    Knee flexion 4-   Knee extension 3+ w/pain   Ankle dorsiflexion 4-   Ankle plantarflexion 3+ w/pain   Ankle inversion 4- w/pain   Ankle eversion 3+ w/pain    (Blank rows = not tested)  LUMBAR SPECIAL TESTS:  Straight leg raise test: Positive and FABER test: Positive  FUNCTIONAL TESTS:  5 times sit to stand: 36s  GAIT: Distance walked: in clinic distances Assistive device utilized: None Level of assistance: Complete Independence Comments: antalgic gait, decreased stance time on RLE   TREATMENT DATE:  06/09/24- EVAL                                                                                                                                 PATIENT EDUCATION:  Education details: POC, HEP Person educated: Patient Education method: Medical illustrator Education comprehension: verbalized understanding and returned demonstration  HOME EXERCISE PROGRAM: Access Code: [email or web address] URL: [email or web address] Date: 06/09/2024 Prepared by: Almetta Fam  Exercises - Supine Lower Trunk Rotation  - 1 x daily - 7 x weekly - 2 sets - 10 reps - Supine Bridge  - 1 x daily - 7 x weekly - 2 sets - 10 reps - Supine Single Knee to Chest Stretch  - 1 x daily - 7 x weekly - 2 reps - 15 hold - Supine Figure 4 Piriformis Stretch  - 1 x daily - 7 x weekly - 2 reps - 15 hold - Supine Piriformis Stretch with Foot on Ground  - 1 x daily - 7 x weekly - 2 reps - 15 hold  ASSESSMENT:  CLINICAL IMPRESSION: Patient is a 33 y.o. female who was seen today for physical therapy evaluation and treatment for LBP and R ankle pain. Her pain started from a fall outside in September. She is very stiff and tight all around.  She does walk with a limp due to the pain in her R side. Patient is slow with movements due to her pain. She reports pain in her ankle, which she twisted when she fell. She does have weakness in the R ankle and the RLE. Patient will benefit from PT to decrease her pain levels and improve her flexibility to be able to return to PLOF and run and play with her kids without difficulty.  OBJECTIVE IMPAIRMENTS: Abnormal gait, decreased activity tolerance, difficulty walking, decreased ROM, decreased strength, impaired flexibility, and pain.   ACTIVITY LIMITATIONS: carrying, lifting, bending, squatting, stairs, and locomotion level  PARTICIPATION LIMITATIONS: cleaning, laundry, community activity, and occupation  PERSONAL FACTORS: Time since onset of injury/illness/exacerbation are also affecting patient's functional outcome.   REHAB POTENTIAL: Good  CLINICAL DECISION MAKING: Stable/uncomplicated  EVALUATION COMPLEXITY: Low   GOALS: Goals reviewed with patient? Yes  SHORT TERM GOALS: Target date: 07/21/24  Patient will be independent with initial HEP.  Baseline:  Goal status: INITIAL  2.  Patient will report centralization of radicular symptoms.  Baseline:  Goal status: INITIAL   LONG TERM GOALS: Target date: 09/01/24  Patient will be independent with advanced/ongoing HEP to improve outcomes and carryover.  Baseline:  Goal status: INITIAL  2.  Patient will report 50-75% improvement in low back pain to improve QOL.  Baseline: 8/10 Goal status: INITIAL  3.  Patient will demonstrate full pain free lumbar ROM to perform ADLs.   Baseline:  Goal status: INITIAL  4.  Patient will demonstrate improved functional strength as demonstrated by 5xSTS <20s. Baseline: 36s Goal status: INITIAL  5.  Patient to demonstrate ability to achieve and maintain good spinal alignment/posturing and body mechanics needed for daily activities. Baseline:  Goal status: INITIAL  PLAN:  PT FREQUENCY:  2x/week  PT DURATION: 12 weeks  PLANNED INTERVENTIONS: 97110-Therapeutic exercises, 97530- Therapeutic activity, 97112- Neuromuscular re-education, 97535- Self Care, [id number]- Manual therapy, [phone]- Gait training, [phone]- Electrical stimulation (unattended), [phone]- Traction (mechanical), [id number]- Ionotophoresis 4mg /ml Dexamethasone, 20560 (1-2 muscles), 20561 (3+ muscles)- Dry Needling, Patient/Family education, Balance training, Stair training, Taping, Joint mobilization, Spinal mobilization, Cryotherapy, and Moist heat.  PLAN FOR NEXT SESSION: can try heat and e-stim for pain modulation, stretching for LE and low back, traction   Almetta Fam, PT 06/09/2024, 1:47 PM

## 2024-06-16 NOTE — Therapy (Incomplete)
 OUTPATIENT PHYSICAL THERAPY THORACOLUMBAR EVALUATION   Patient Name: Veronica Bradley MRN: [id number] DOB:27-Nov-1990, 33 y.o., female Today's Date: 06/16/2024  END OF SESSION:    Past Medical History:  Diagnosis Date   Depression    post partum   Eczema    Gestational diabetes    Other allergic rhinitis 07/26/2021   Other atopic dermatitis 07/26/2021   Prediabetes    Seasonal allergies    Past Surgical History:  Procedure Laterality Date   brazillian butt lift  2020   CESAREAN SECTION     DILATION AND CURETTAGE OF UTERUS     X5   LIPOSUCTION     removed from abd and back- infected into hips and butt   WISDOM TOOTH EXTRACTION     Patient Active Problem List   Diagnosis Date Noted   GDM, class A2 09/13/2022   Prior cesarean delivery 08/17/2022   Alpha thalassemia silent carrier 08/05/2022   Carrier of spinal muscular atrophy 08/05/2022   Sickle cell trait 08/05/2022    PCP: No PCP  REFERRING PROVIDER: Delinda Davis  REFERRING DIAG:  S39.012A (ICD-10-CM) - Strain of muscle, fascia and tendon of lower back, initial encounter    Rationale for Evaluation and Treatment: Rehabilitation  THERAPY DIAG:  No diagnosis found.  ONSET DATE: 05/16/24  SUBJECTIVE:                                                                                                                                                                                           SUBJECTIVE STATEMENT: I am still hurting, it is in my ankle and my back on the right side. It all started from a fall. I slipped down steps outside and my ankle turned and I fell on the R side.   PERTINENT HISTORY:  Veronica Bradley is a 33 y.o. female presenting for 5 day history of persistent right-sided low back pain right ankle pain, right foot pain, right-sided upper back pain.  Symptoms started after she suffered a fall from a faulty step on 05/16/2024.  Patient was seen in a different state and an emergency room.  Had  imaging done that was negative.  Was advised to follow-up for physical therapy.  Per patient, her employer is not going to allow her to return to work without completing physical therapy and documentation stating that she can return to work.  Patient has been using Tylenol  without relief.  PAIN:  Are you having pain? Yes: NPRS scale: 8/10 Pain location: R ankle up leg and into back on the R side Pain description: some numbness and tingling, sometimes it is sharp, comes and goes  Aggravating  factors: moving around and chasing the kids, lifting and not using the right mechanics  Relieving factors: the medicine helps but I don't really like taking it  PRECAUTIONS: None  RED FLAGS: None   WEIGHT BEARING RESTRICTIONS: No  FALLS:  Has patient fallen in last 6 months? Yes. Number of falls 1  LIVING ENVIRONMENT: Lives with: lives with their family Lives in: House/apartment Stairs: Yes: Internal: 20 steps; on right going up  OCCUPATION: [employer]- I stand in one spot for hours   PLOF: Independent  PATIENT GOALS: to get better and move better   NEXT MD VISIT:   OBJECTIVE:  Note: Objective measures were completed at Evaluation unless otherwise noted.  DIAGNOSTIC FINDINGS:  Negative images    COGNITION: Overall cognitive status: Within functional limits for tasks assessed     SENSATION: WFL  MUSCLE LENGTH: Very tight in hamstrings, glutes, piriformis, low back  PALPATION: TTP R glute  LUMBAR ROM:   AROM eval  Flexion 75% with pain  Extension 50%  Right lateral flexion WFL  Left lateral flexion 50% with pain  Right rotation 75%  Left rotation 75%    (Blank rows = not tested)  LOWER EXTREMITY ROM:   WFL, some pain with ankle PF and leg extension on RLE   LOWER EXTREMITY MMT:    MMT Right eval Left eval  Hip flexion 4-   Hip extension    Hip abduction    Hip adduction    Hip internal rotation    Hip external rotation    Knee flexion 4-   Knee extension 3+  w/pain   Ankle dorsiflexion 4-   Ankle plantarflexion 3+ w/pain   Ankle inversion 4- w/pain   Ankle eversion 3+ w/pain    (Blank rows = not tested)  LUMBAR SPECIAL TESTS:  Straight leg raise test: Positive and FABER test: Positive  FUNCTIONAL TESTS:  5 times sit to stand: 36s  GAIT: Distance walked: in clinic distances Assistive device utilized: None Level of assistance: Complete Independence Comments: antalgic gait, decreased stance time on RLE   TREATMENT DATE:  06/17/24 NuStep Laying on MH while doing stretches- LTR, HS, ITB, SKTC, DKTC, piriformis, glutes Manual traction  Feet on pball rotations and knees to chest Bridges STS    06/09/24- EVAL                                                                                                                                 PATIENT EDUCATION:  Education details: POC, HEP Person educated: Patient Education method: Medical Illustrator Education comprehension: verbalized understanding and returned demonstration  HOME EXERCISE PROGRAM: Access Code: [email or web address] URL: [email or web address] Date: 06/09/2024 Prepared by: Almetta Fam  Exercises - Supine Lower Trunk Rotation  - 1 x daily - 7 x weekly - 2 sets - 10 reps - Supine Bridge  - 1 x daily - 7 x weekly - 2 sets - 10 reps - Supine  Single Knee to Chest Stretch  - 1 x daily - 7 x weekly - 2 reps - 15 hold - Supine Figure 4 Piriformis Stretch  - 1 x daily - 7 x weekly - 2 reps - 15 hold - Supine Piriformis Stretch with Foot on Ground  - 1 x daily - 7 x weekly - 2 reps - 15 hold  ASSESSMENT:  CLINICAL IMPRESSION: Patient is a 33 y.o. female who was seen today for physical therapy evaluation and treatment for LBP and R ankle pain. Her pain started from a fall outside in September. She is very stiff and tight all around. She does walk with a limp due to the pain in her R side. Patient is slow with movements due to her pain. She reports pain in her  ankle, which she twisted when she fell. She does have weakness in the R ankle and the RLE. Patient will benefit from PT to decrease her pain levels and improve her flexibility to be able to return to PLOF and run and play with her kids without difficulty.  OBJECTIVE IMPAIRMENTS: Abnormal gait, decreased activity tolerance, difficulty walking, decreased ROM, decreased strength, impaired flexibility, and pain.   ACTIVITY LIMITATIONS: carrying, lifting, bending, squatting, stairs, and locomotion level  PARTICIPATION LIMITATIONS: cleaning, laundry, community activity, and occupation  PERSONAL FACTORS: Time since onset of injury/illness/exacerbation are also affecting patient's functional outcome.   REHAB POTENTIAL: Good  CLINICAL DECISION MAKING: Stable/uncomplicated  EVALUATION COMPLEXITY: Low   GOALS: Goals reviewed with patient? Yes  SHORT TERM GOALS: Target date: 07/21/24  Patient will be independent with initial HEP.  Baseline:  Goal status: INITIAL  2.  Patient will report centralization of radicular symptoms.  Baseline:  Goal status: INITIAL   LONG TERM GOALS: Target date: 09/01/24  Patient will be independent with advanced/ongoing HEP to improve outcomes and carryover.  Baseline:  Goal status: INITIAL  2.  Patient will report 50-75% improvement in low back pain to improve QOL.  Baseline: 8/10 Goal status: INITIAL  3.  Patient will demonstrate full pain free lumbar ROM to perform ADLs.   Baseline:  Goal status: INITIAL  4.  Patient will demonstrate improved functional strength as demonstrated by 5xSTS <20s. Baseline: 36s Goal status: INITIAL  5.  Patient to demonstrate ability to achieve and maintain good spinal alignment/posturing and body mechanics needed for daily activities. Baseline:  Goal status: INITIAL  PLAN:  PT FREQUENCY: 2x/week  PT DURATION: 12 weeks  PLANNED INTERVENTIONS: 97110-Therapeutic exercises, 97530- Therapeutic activity, 97112-  Neuromuscular re-education, 97535- Self Care, 02859- Manual therapy, [phone]- Gait training, [phone]- Electrical stimulation (unattended), [phone]- Traction (mechanical), [id number]- Ionotophoresis 4mg /ml Dexamethasone, 20560 (1-2 muscles), 20561 (3+ muscles)- Dry Needling, Patient/Family education, Balance training, Stair training, Taping, Joint mobilization, Spinal mobilization, Cryotherapy, and Moist heat.  PLAN FOR NEXT SESSION: can try heat and e-stim for pain modulation, stretching for LE and low back, traction   Almetta Fam, PT 06/16/2024, 12:12 PM

## 2024-06-17 ENCOUNTER — Ambulatory Visit

## 2024-06-17 NOTE — Therapy (Incomplete)
 OUTPATIENT PHYSICAL THERAPY THORACOLUMBAR TREATMENT   Patient Name: Veronica Bradley MRN: [id number] DOB:1991-07-29, 33 y.o., female Today's Date: 06/18/2024  END OF SESSION:  PT End of Session - 06/18/24 1222     Visit Number 2    Date for Recertification  09/01/24    Authorization Type [place] [employer]    PT Start Time 1228    PT Stop Time 1310    PT Time Calculation (min) 42 min           Past Medical History:  Diagnosis Date   Depression    post partum   Eczema    Gestational diabetes    Other allergic rhinitis 07/26/2021   Other atopic dermatitis 07/26/2021   Prediabetes    Seasonal allergies    Past Surgical History:  Procedure Laterality Date   brazillian butt lift  2020   CESAREAN SECTION     DILATION AND CURETTAGE OF UTERUS     X5   LIPOSUCTION     removed from abd and back- infected into hips and butt   WISDOM TOOTH EXTRACTION     Patient Active Problem List   Diagnosis Date Noted   GDM, class A2 09/13/2022   Prior cesarean delivery 08/17/2022   Alpha thalassemia silent carrier 08/05/2022   Carrier of spinal muscular atrophy 08/05/2022   Sickle cell trait 08/05/2022    PCP: No PCP  REFERRING PROVIDER: Delinda Davis  REFERRING DIAG:  S39.012A (ICD-10-CM) - Strain of muscle, fascia and tendon of lower back, initial encounter    Rationale for Evaluation and Treatment: Rehabilitation  THERAPY DIAG:  Radiculopathy, lumbosacral region  Muscle weakness (generalized)  Pain in right ankle and joints of right foot  ONSET DATE: 05/16/24  SUBJECTIVE:                                                                                                                                                                                           SUBJECTIVE STATEMENT: I am still a little tight. My husband has been helping me with my exercises. Today I don't feel as much pain, maybe still a little tense. Pain is a 5-6.  PERTINENT HISTORY:  Kameisha Dragon is a 33  y.o. female presenting for 5 day history of persistent right-sided low back pain right ankle pain, right foot pain, right-sided upper back pain.  Symptoms started after she suffered a fall from a faulty step on 05/16/2024.  Patient was seen in a different state and an emergency room.  Had imaging done that was negative.  Was advised to follow-up for physical therapy.  Per patient, her employer is not going to  allow her to return to work without completing physical therapy and documentation stating that she can return to work.  Patient has been using Tylenol  without relief.  PAIN:  Are you having pain? Yes: NPRS scale: 8/10 Pain location: R ankle up leg and into back on the R side Pain description: some numbness and tingling, sometimes it is sharp, comes and goes  Aggravating factors: moving around and chasing the kids, lifting and not using the right mechanics  Relieving factors: the medicine helps but I don't really like taking it  PRECAUTIONS: None  RED FLAGS: None   WEIGHT BEARING RESTRICTIONS: No  FALLS:  Has patient fallen in last 6 months? Yes. Number of falls 1  LIVING ENVIRONMENT: Lives with: lives with their family Lives in: House/apartment Stairs: Yes: Internal: 20 steps; on right going up  OCCUPATION: [employer]- I stand in one spot for hours   PLOF: Independent  PATIENT GOALS: to get better and move better   NEXT MD VISIT:   OBJECTIVE:  Note: Objective measures were completed at Evaluation unless otherwise noted.  DIAGNOSTIC FINDINGS:  Negative images    COGNITION: Overall cognitive status: Within functional limits for tasks assessed     SENSATION: WFL  MUSCLE LENGTH: Very tight in hamstrings, glutes, piriformis, low back  PALPATION: TTP R glute  LUMBAR ROM:   AROM eval  Flexion 75% with pain  Extension 50%  Right lateral flexion WFL  Left lateral flexion 50% with pain  Right rotation 75%  Left rotation 75%    (Blank rows = not tested)  LOWER  EXTREMITY ROM:   WFL, some pain with ankle PF and leg extension on RLE   LOWER EXTREMITY MMT:    MMT Right eval Left eval  Hip flexion 4-   Hip extension    Hip abduction    Hip adduction    Hip internal rotation    Hip external rotation    Knee flexion 4-   Knee extension 3+ w/pain   Ankle dorsiflexion 4-   Ankle plantarflexion 3+ w/pain   Ankle inversion 4- w/pain   Ankle eversion 3+ w/pain    (Blank rows = not tested)  LUMBAR SPECIAL TESTS:  Straight leg raise test: Positive and FABER test: Positive  FUNCTIONAL TESTS:  5 times sit to stand: 36s  GAIT: Distance walked: in clinic distances Assistive device utilized: None Level of assistance: Complete Independence Comments: antalgic gait, decreased stance time on RLE   TREATMENT DATE:  06/17/24 Laying on MH while passive stretches- LTR, HS, ITB, SKTC, DKTC, piriformis, glutes Feet on pball rotations and knees to chest Bridges x10 STS 2x10 Pball roll out x10   06/09/24- EVAL                                                                                                                                 PATIENT EDUCATION:  Education details: POC, HEP Person educated: Patient Education method:  Explanation and Demonstration Education comprehension: verbalized understanding and returned demonstration  HOME EXERCISE PROGRAM: Access Code: [email or web address] URL: [email or web address] Date: 06/09/2024 Prepared by: Almetta Fam  Exercises - Supine Lower Trunk Rotation  - 1 x daily - 7 x weekly - 2 sets - 10 reps - Supine Bridge  - 1 x daily - 7 x weekly - 2 sets - 10 reps - Supine Single Knee to Chest Stretch  - 1 x daily - 7 x weekly - 2 reps - 15 hold - Supine Figure 4 Piriformis Stretch  - 1 x daily - 7 x weekly - 2 reps - 15 hold - Supine Piriformis Stretch with Foot on Ground  - 1 x daily - 7 x weekly - 2 reps - 15 hold  ASSESSMENT:  CLINICAL IMPRESSION: Patient is a 33 y.o. female who was seen today  for physical therapy evaluation for LBP and R ankle pain. Her pain started from a fall outside in September. She is very stiff and tight all around. Today we focused mostly on mobility and flexibility. Her R side is tight compared to the L. She does walk with a limp due to the pain in her R side. Patient is slow with movements due to her pain. Patient will benefit from PT to decrease her pain levels and improve her flexibility to be able to return to PLOF and run and play with her kids without difficulty.  OBJECTIVE IMPAIRMENTS: Abnormal gait, decreased activity tolerance, difficulty walking, decreased ROM, decreased strength, impaired flexibility, and pain.   ACTIVITY LIMITATIONS: carrying, lifting, bending, squatting, stairs, and locomotion level  PARTICIPATION LIMITATIONS: cleaning, laundry, community activity, and occupation  PERSONAL FACTORS: Time since onset of injury/illness/exacerbation are also affecting patient's functional outcome.   REHAB POTENTIAL: Good  CLINICAL DECISION MAKING: Stable/uncomplicated  EVALUATION COMPLEXITY: Low   GOALS: Goals reviewed with patient? Yes  SHORT TERM GOALS: Target date: 07/21/24  Patient will be independent with initial HEP.  Baseline:  Goal status: INITIAL  2.  Patient will report centralization of radicular symptoms.  Baseline:  Goal status: INITIAL   LONG TERM GOALS: Target date: 09/01/24  Patient will be independent with advanced/ongoing HEP to improve outcomes and carryover.  Baseline:  Goal status: INITIAL  2.  Patient will report 50-75% improvement in low back pain to improve QOL.  Baseline: 8/10 Goal status: INITIAL  3.  Patient will demonstrate full pain free lumbar ROM to perform ADLs.   Baseline:  Goal status: INITIAL  4.  Patient will demonstrate improved functional strength as demonstrated by 5xSTS <20s. Baseline: 36s Goal status: INITIAL  5.  Patient to demonstrate ability to achieve and maintain good spinal  alignment/posturing and body mechanics needed for daily activities. Baseline:  Goal status: INITIAL  PLAN:  PT FREQUENCY: 2x/week  PT DURATION: 12 weeks  PLANNED INTERVENTIONS: 97110-Therapeutic exercises, 97530- Therapeutic activity, 97112- Neuromuscular re-education, 97535- Self Care, 02859- Manual therapy, [phone]- Gait training, [phone]- Electrical stimulation (unattended), [phone]- Traction (mechanical), [id number]- Ionotophoresis 4mg /ml Dexamethasone, 20560 (1-2 muscles), 20561 (3+ muscles)- Dry Needling, Patient/Family education, Balance training, Stair training, Taping, Joint mobilization, Spinal mobilization, Cryotherapy, and Moist heat.  PLAN FOR NEXT SESSION: can try heat and e-stim for pain modulation, stretching for LE and low back, traction   Almetta Fam, PT 06/18/2024, 1:09 PM  [facility] at [facility] [address] [facility] [place], [place], [address] Phone: [phone]   Fax:  [phone]  Patient Details  Name: Ragna Kramlich MRN: [id number] Date of  Birth: May 03, 1991 Referring Provider:  Irving Delinda ORN, MD  Encounter Date: 06/18/2024   Almetta Fam, PT 06/18/2024, 1:09 PM  [facility] at [facility] [address] [facility]. [place], [place], [address] Phone: [phone]   Fax:  [phone]

## 2024-06-18 ENCOUNTER — Ambulatory Visit

## 2024-06-18 DIAGNOSIS — M6281 Muscle weakness (generalized): Secondary | ICD-10-CM

## 2024-06-18 DIAGNOSIS — M5417 Radiculopathy, lumbosacral region: Secondary | ICD-10-CM | POA: Diagnosis not present

## 2024-06-18 DIAGNOSIS — M25571 Pain in right ankle and joints of right foot: Secondary | ICD-10-CM

## 2024-06-24 ENCOUNTER — Ambulatory Visit: Attending: Family Medicine | Admitting: Physical Therapy

## 2024-06-24 ENCOUNTER — Encounter: Payer: Self-pay | Admitting: Physical Therapy

## 2024-06-24 DIAGNOSIS — M6281 Muscle weakness (generalized): Secondary | ICD-10-CM | POA: Insufficient documentation

## 2024-06-24 DIAGNOSIS — M25571 Pain in right ankle and joints of right foot: Secondary | ICD-10-CM | POA: Diagnosis present

## 2024-06-24 DIAGNOSIS — M5417 Radiculopathy, lumbosacral region: Secondary | ICD-10-CM | POA: Diagnosis present

## 2024-06-24 NOTE — Therapy (Signed)
 OUTPATIENT PHYSICAL THERAPY THORACOLUMBAR TREATMENT   Patient Name: Veronica Bradley MRN: [id number] DOB:01-07-1991, 33 y.o., female Today's Date: 06/24/2024  END OF SESSION:  PT End of Session - 06/24/24 1429     Visit Number 3    Authorization Type [place] Medicaid    PT Start Time 1430    PT Stop Time 1515    PT Time Calculation (min) 45 min    Activity Tolerance Patient tolerated treatment well    Behavior During Therapy [facility] for tasks assessed/performed           Past Medical History:  Diagnosis Date   Depression    post partum   Eczema    Gestational diabetes    Other allergic rhinitis 07/26/2021   Other atopic dermatitis 07/26/2021   Prediabetes    Seasonal allergies    Past Surgical History:  Procedure Laterality Date   brazillian butt lift  2020   CESAREAN SECTION     DILATION AND CURETTAGE OF UTERUS     X5   LIPOSUCTION     removed from abd and back- infected into hips and butt   WISDOM TOOTH EXTRACTION     Patient Active Problem List   Diagnosis Date Noted   GDM, class A2 09/13/2022   Prior cesarean delivery 08/17/2022   Alpha thalassemia silent carrier 08/05/2022   Carrier of spinal muscular atrophy 08/05/2022   Sickle cell trait 08/05/2022    PCP: No PCP  REFERRING PROVIDER: Delinda Davis  REFERRING DIAG:  S39.012A (ICD-10-CM) - Strain of muscle, fascia and tendon of lower back, initial encounter    Rationale for Evaluation and Treatment: Rehabilitation  THERAPY DIAG:  Muscle weakness (generalized)  Radiculopathy, lumbosacral region  ONSET DATE: 05/16/24  SUBJECTIVE:                                                                                                                                                                                           SUBJECTIVE STATEMENT: I felt better today some tightness in her R ankle and glute.  PERTINENT HISTORY:  Veronica Bradley is a 33 y.o. female presenting for 5 day history of persistent  right-sided low back pain right ankle pain, right foot pain, right-sided upper back pain.  Symptoms started after she suffered a fall from a faulty step on 05/16/2024.  Patient was seen in a different state and an emergency room.  Had imaging done that was negative.  Was advised to follow-up for physical therapy.  Per patient, her employer is not going to allow her to return to work without completing physical therapy and documentation stating that she can return to  work.  Patient has been using Tylenol  without relief.  PAIN:  Are you having pain? Yes: NPRS scale: 5/10 Pain location: R ankle up leg and into back on the R side Pain description: some numbness and tingling, sometimes it is sharp, comes and goes  Aggravating factors: moving around and chasing the kids, lifting and not using the right mechanics  Relieving factors: the medicine helps but I don't really like taking it  PRECAUTIONS: None  RED FLAGS: None   WEIGHT BEARING RESTRICTIONS: No  FALLS:  Has patient fallen in last 6 months? Yes. Number of falls 1  LIVING ENVIRONMENT: Lives with: lives with their family Lives in: House/apartment Stairs: Yes: Internal: 20 steps; on right going up  OCCUPATION: [employer]- I stand in one spot for hours   PLOF: Independent  PATIENT GOALS: to get better and move better   NEXT MD VISIT:   OBJECTIVE:  Note: Objective measures were completed at Evaluation unless otherwise noted.  DIAGNOSTIC FINDINGS:  Negative images    COGNITION: Overall cognitive status: Within functional limits for tasks assessed     SENSATION: WFL  MUSCLE LENGTH: Very tight in hamstrings, glutes, piriformis, low back  PALPATION: TTP R glute  LUMBAR ROM:   AROM eval  Flexion 75% with pain  Extension 50%  Right lateral flexion WFL  Left lateral flexion 50% with pain  Right rotation 75%  Left rotation 75%    (Blank rows = not tested)  LOWER EXTREMITY ROM:   WFL, some pain with ankle PF and leg  extension on RLE   LOWER EXTREMITY MMT:    MMT Right eval Left eval  Hip flexion 4-   Hip extension    Hip abduction    Hip adduction    Hip internal rotation    Hip external rotation    Knee flexion 4-   Knee extension 3+ w/pain   Ankle dorsiflexion 4-   Ankle plantarflexion 3+ w/pain   Ankle inversion 4- w/pain   Ankle eversion 3+ w/pain    (Blank rows = not tested)  LUMBAR SPECIAL TESTS:  Straight leg raise test: Positive and FABER test: Positive  FUNCTIONAL TESTS:  5 times sit to stand: 36s  GAIT: Distance walked: in clinic distances Assistive device utilized: None Level of assistance: Complete Independence Comments: antalgic gait, decreased stance time on RLE   TREATMENT DATE:  06/24/24 MHP applied to LB in supine   Bridges 2x5  Feet on pball rotations and knees to chest passive stretches- LTR, HS, ITB, SKTC, DKTC, piriformis, glutes STS 2x10   06/17/24 Laying on MH while passive stretches- LTR, HS, ITB, SKTC, DKTC, piriformis, glutes Feet on pball rotations and knees to chest Bridges x10 STS 2x10 Pball roll out x10   06/09/24- EVAL  PATIENT EDUCATION:  Education details: POC, HEP Person educated: Patient Education method: Medical Illustrator Education comprehension: verbalized understanding and returned demonstration  HOME EXERCISE PROGRAM: Access Code: [email or web address] URL: [email or web address] Date: 06/09/2024 Prepared by: Almetta Fam  Exercises - Supine Lower Trunk Rotation  - 1 x daily - 7 x weekly - 2 sets - 10 reps - Supine Bridge  - 1 x daily - 7 x weekly - 2 sets - 10 reps - Supine Single Knee to Chest Stretch  - 1 x daily - 7 x weekly - 2 reps - 15 hold - Supine Figure 4 Piriformis Stretch  - 1 x daily - 7 x weekly - 2 reps - 15 hold - Supine Piriformis Stretch with Foot on Ground  - 1 x  daily - 7 x weekly - 2 reps - 15 hold  ASSESSMENT:  CLINICAL IMPRESSION: Patient is a 33 y.o. female who was seen today for physical therapy LBP and R ankle pain. Her pain started from a fall outside in September. She  remains very stiff and tight all around with rigid mobility.  Again focused mostly on mobility and flexibility. Her R side is tight compared to the L. Patient will benefit from PT to decrease her pain levels and improve her flexibility to be able to return to PLOF and run and play with her kids without difficulty.  OBJECTIVE IMPAIRMENTS: Abnormal gait, decreased activity tolerance, difficulty walking, decreased ROM, decreased strength, impaired flexibility, and pain.   ACTIVITY LIMITATIONS: carrying, lifting, bending, squatting, stairs, and locomotion level  PARTICIPATION LIMITATIONS: cleaning, laundry, community activity, and occupation  PERSONAL FACTORS: Time since onset of injury/illness/exacerbation are also affecting patient's functional outcome.   REHAB POTENTIAL: Good  CLINICAL DECISION MAKING: Stable/uncomplicated  EVALUATION COMPLEXITY: Low   GOALS: Goals reviewed with patient? Yes  SHORT TERM GOALS: Target date: 07/21/24  Patient will be independent with initial HEP.  Baseline:  Goal status: INITIAL  2.  Patient will report centralization of radicular symptoms.  Baseline:  Goal status: INITIAL   LONG TERM GOALS: Target date: 09/01/24  Patient will be independent with advanced/ongoing HEP to improve outcomes and carryover.  Baseline:  Goal status: INITIAL  2.  Patient will report 50-75% improvement in low back pain to improve QOL.  Baseline: 8/10 Goal status: INITIAL  3.  Patient will demonstrate full pain free lumbar ROM to perform ADLs.   Baseline:  Goal status: INITIAL  4.  Patient will demonstrate improved functional strength as demonstrated by 5xSTS <20s. Baseline: 36s Goal status: INITIAL  5.  Patient to demonstrate ability to achieve  and maintain good spinal alignment/posturing and body mechanics needed for daily activities. Baseline:  Goal status: INITIAL  PLAN:  PT FREQUENCY: 2x/week  PT DURATION: 12 weeks  PLANNED INTERVENTIONS: 97110-Therapeutic exercises, 97530- Therapeutic activity, 97112- Neuromuscular re-education, 97535- Self Care, 02859- Manual therapy, [phone]- Gait training, [phone]- Electrical stimulation (unattended), [phone]- Traction (mechanical), [id number]- Ionotophoresis 4mg /ml Dexamethasone, 20560 (1-2 muscles), 20561 (3+ muscles)- Dry Needling, Patient/Family education, Balance training, Stair training, Taping, Joint mobilization, Spinal mobilization, Cryotherapy, and Moist heat.  PLAN FOR NEXT SESSION: can try heat and e-stim for pain modulation, stretching for LE and low back, traction   Tanda KANDICE Sorrow, PTA

## 2024-07-01 ENCOUNTER — Encounter: Payer: Self-pay | Admitting: Physical Therapy

## 2024-07-01 ENCOUNTER — Ambulatory Visit: Admitting: Physical Therapy

## 2024-07-01 DIAGNOSIS — M6281 Muscle weakness (generalized): Secondary | ICD-10-CM | POA: Diagnosis not present

## 2024-07-01 DIAGNOSIS — M5417 Radiculopathy, lumbosacral region: Secondary | ICD-10-CM

## 2024-07-01 DIAGNOSIS — M25571 Pain in right ankle and joints of right foot: Secondary | ICD-10-CM

## 2024-07-01 NOTE — Therapy (Signed)
 OUTPATIENT PHYSICAL THERAPY THORACOLUMBAR TREATMENT   Patient Name: Veronica Bradley MRN: [id number] DOB:1991/06/27, 33 y.o., female Today's Date: 07/01/2024  END OF SESSION:  PT End of Session - 07/01/24 1518     Visit Number 4    Date for Recertification  09/01/24    PT Start Time 1518    PT Stop Time 1600    PT Time Calculation (min) 42 min    Activity Tolerance Patient tolerated treatment well    Behavior During Therapy [facility] for tasks assessed/performed           Past Medical History:  Diagnosis Date   Depression    post partum   Eczema    Gestational diabetes    Other allergic rhinitis 07/26/2021   Other atopic dermatitis 07/26/2021   Prediabetes    Seasonal allergies    Past Surgical History:  Procedure Laterality Date   brazillian butt lift  2020   CESAREAN SECTION     DILATION AND CURETTAGE OF UTERUS     X5   LIPOSUCTION     removed from abd and back- infected into hips and butt   WISDOM TOOTH EXTRACTION     Patient Active Problem List   Diagnosis Date Noted   GDM, class A2 09/13/2022   Prior cesarean delivery 08/17/2022   Alpha thalassemia silent carrier 08/05/2022   Carrier of spinal muscular atrophy 08/05/2022   Sickle cell trait 08/05/2022    PCP: No PCP  REFERRING PROVIDER: Delinda Davis  REFERRING DIAG:  S39.012A (ICD-10-CM) - Strain of muscle, fascia and tendon of lower back, initial encounter    Rationale for Evaluation and Treatment: Rehabilitation  THERAPY DIAG:  Muscle weakness (generalized)  Radiculopathy, lumbosacral region  Pain in right ankle and joints of right foot  ONSET DATE: 05/16/24  SUBJECTIVE:                                                                                                                                                                                           SUBJECTIVE STATEMENT: Been feeling ok, moving a lot, hit her foot on the bed yesterday  PERTINENT HISTORY:  Amberlin Contino is a 33 y.o.  female presenting for 5 day history of persistent right-sided low back pain right ankle pain, right foot pain, right-sided upper back pain.  Symptoms started after she suffered a fall from a faulty step on 05/16/2024.  Patient was seen in a different state and an emergency room.  Had imaging done that was negative.  Was advised to follow-up for physical therapy.  Per patient, her employer is not going to allow her to return to work  without completing physical therapy and documentation stating that she can return to work.  Patient has been using Tylenol  without relief.  PAIN:  Are you having pain? Yes: NPRS scale: 5/10 Pain location: R ankle up leg and into back on the R side Pain description: some numbness and tingling, sometimes it is sharp, comes and goes  Aggravating factors: moving around and chasing the kids, lifting and not using the right mechanics  Relieving factors: the medicine helps but I don't really like taking it  PRECAUTIONS: None  RED FLAGS: None   WEIGHT BEARING RESTRICTIONS: No  FALLS:  Has patient fallen in last 6 months? Yes. Number of falls 1  LIVING ENVIRONMENT: Lives with: lives with their family Lives in: House/apartment Stairs: Yes: Internal: 20 steps; on right going up  OCCUPATION: [employer]- I stand in one spot for hours   PLOF: Independent  PATIENT GOALS: to get better and move better   NEXT MD VISIT:   OBJECTIVE:  Note: Objective measures were completed at Evaluation unless otherwise noted.  DIAGNOSTIC FINDINGS:  Negative images    COGNITION: Overall cognitive status: Within functional limits for tasks assessed     SENSATION: WFL  MUSCLE LENGTH: Very tight in hamstrings, glutes, piriformis, low back  PALPATION: TTP R glute  LUMBAR ROM:   AROM eval 07/01/24  Flexion 75% with pain WFL  Extension 50% WFL  Right lateral flexion WFL Limited 50%  Left lateral flexion 50% with pain WFL  Right rotation 75% WFL  Left rotation 75%  WFL    (Blank rows = not tested)  LOWER EXTREMITY ROM:   WFL, some pain with ankle PF and leg extension on RLE   LOWER EXTREMITY MMT:    MMT Right eval Left eval  Hip flexion 4-   Hip extension    Hip abduction    Hip adduction    Hip internal rotation    Hip external rotation    Knee flexion 4-   Knee extension 3+ w/pain   Ankle dorsiflexion 4-   Ankle plantarflexion 3+ w/pain   Ankle inversion 4- w/pain   Ankle eversion 3+ w/pain    (Blank rows = not tested)  LUMBAR SPECIAL TESTS:  Straight leg raise test: Positive and FABER test: Positive  FUNCTIONAL TESTS:  5 times sit to stand: 36s  GAIT: Distance walked: in clinic distances Assistive device utilized: None Level of assistance: Complete Independence Comments: antalgic gait, decreased stance time on RLE   TREATMENT DATE:  07/01/24 NuStep L5 x4 min STS 2x10 Rows green 2x10 Shoulder Ext green 2x10 Pball roll out lumbar stretch   06/24/24 MHP applied to LB in supine   Bridges 2x5  Feet on pball rotations and knees to chest passive stretches- LTR, HS, ITB, SKTC, DKTC, piriformis, glutes STS 2x10   06/17/24 Laying on MH while passive stretches- LTR, HS, ITB, SKTC, DKTC, piriformis, glutes Feet on pball rotations and knees to chest Bridges x10 STS 2x10 Pball roll out x10   06/09/24- EVAL  PATIENT EDUCATION:  Education details: POC, HEP Person educated: Patient Education method: Medical Illustrator Education comprehension: verbalized understanding and returned demonstration  HOME EXERCISE PROGRAM: Access Code: [email or web address] URL: [email or web address] Date: 06/09/2024 Prepared by: Almetta Fam  Exercises - Supine Lower Trunk Rotation  - 1 x daily - 7 x weekly - 2 sets - 10 reps - Supine Bridge  - 1 x daily - 7 x weekly - 2 sets - 10 reps - Supine Single Knee  to Chest Stretch  - 1 x daily - 7 x weekly - 2 reps - 15 hold - Supine Figure 4 Piriformis Stretch  - 1 x daily - 7 x weekly - 2 reps - 15 hold - Supine Piriformis Stretch with Foot on Ground  - 1 x daily - 7 x weekly - 2 reps - 15 hold  ASSESSMENT:  CLINICAL IMPRESSION: Patient is a 33 y.o. female who was seen today for physical therapy LBP and R ankle pain. Her pain started from a fall outside in September. She remains very stiff and tight all around with rigid mobility.  She was able to tolerated a mote active session but with increase fatigue and slow movements at times. She has progressed increasing her lumbar ROM. Postural cues needed with rows and extensions.  Patient will benefit from PT to decrease her pain levels and improve her flexibility to be able to return to PLOF and run and play with her kids without difficulty.  OBJECTIVE IMPAIRMENTS: Abnormal gait, decreased activity tolerance, difficulty walking, decreased ROM, decreased strength, impaired flexibility, and pain.   ACTIVITY LIMITATIONS: carrying, lifting, bending, squatting, stairs, and locomotion level  PARTICIPATION LIMITATIONS: cleaning, laundry, community activity, and occupation  PERSONAL FACTORS: Time since onset of injury/illness/exacerbation are also affecting patient's functional outcome.   REHAB POTENTIAL: Good  CLINICAL DECISION MAKING: Stable/uncomplicated  EVALUATION COMPLEXITY: Low   GOALS: Goals reviewed with patient? Yes  SHORT TERM GOALS: Target date: 07/21/24  Patient will be independent with initial HEP.  Baseline:  Goal status: Met 07/01/24  2.  Patient will report centralization of radicular symptoms.  Baseline:  Goal status: ongoing 07/01/24   LONG TERM GOALS: Target date: 09/01/24  Patient will be independent with advanced/ongoing HEP to improve outcomes and carryover.  Baseline:  Goal status: INITIAL  2.  Patient will report 50-75% improvement in low back pain to improve QOL.   Baseline: 8/10 Goal status: INITIAL  3.  Patient will demonstrate full pain free lumbar ROM to perform ADLs.   Baseline:  Goal status: Progressing 07/01/24  4.  Patient will demonstrate improved functional strength as demonstrated by 5xSTS <20s. Baseline: 36s Goal status: INITIAL  5.  Patient to demonstrate ability to achieve and maintain good spinal alignment/posturing and body mechanics needed for daily activities. Baseline:  Goal status: INITIAL  PLAN:  PT FREQUENCY: 2x/week  PT DURATION: 12 weeks  PLANNED INTERVENTIONS: 97110-Therapeutic exercises, 97530- Therapeutic activity, 97112- Neuromuscular re-education, 97535- Self Care, 02859- Manual therapy, [phone]- Gait training, [phone]- Electrical stimulation (unattended), [phone]- Traction (mechanical), [id number]- Ionotophoresis 4mg /ml Dexamethasone, 79439 (1-2 muscles), 20561 (3+ muscles)- Dry Needling, Patient/Family education, Balance training, Stair training, Taping, Joint mobilization, Spinal mobilization, Cryotherapy, and Moist heat.  PLAN FOR NEXT SESSION: can try heat and e-stim for pain modulation, stretching for LE and low back, traction   Tanda KANDICE Sorrow, PTA

## 2024-07-08 ENCOUNTER — Ambulatory Visit

## 2024-07-08 NOTE — Therapy (Incomplete)
 OUTPATIENT PHYSICAL THERAPY THORACOLUMBAR TREATMENT   Patient Name: Carina Chaplin MRN: [id number] DOB:11-08-1990, 33 y.o., female Today's Date: 07/08/2024  END OF SESSION:     Past Medical History:  Diagnosis Date   Depression    post partum   Eczema    Gestational diabetes    Other allergic rhinitis 07/26/2021   Other atopic dermatitis 07/26/2021   Prediabetes    Seasonal allergies    Past Surgical History:  Procedure Laterality Date   brazillian butt lift  2020   CESAREAN SECTION     DILATION AND CURETTAGE OF UTERUS     X5   LIPOSUCTION     removed from abd and back- infected into hips and butt   WISDOM TOOTH EXTRACTION     Patient Active Problem List   Diagnosis Date Noted   GDM, class A2 09/13/2022   Prior cesarean delivery 08/17/2022   Alpha thalassemia silent carrier 08/05/2022   Carrier of spinal muscular atrophy 08/05/2022   Sickle cell trait 08/05/2022    PCP: No PCP  REFERRING PROVIDER: Delinda Davis  REFERRING DIAG:  S39.012A (ICD-10-CM) - Strain of muscle, fascia and tendon of lower back, initial encounter    Rationale for Evaluation and Treatment: Rehabilitation  THERAPY DIAG:  No diagnosis found.  ONSET DATE: 05/16/24  SUBJECTIVE:                                                                                                                                                                                           SUBJECTIVE STATEMENT: Been feeling ok, moving a lot, hit her foot on the bed yesterday  PERTINENT HISTORY:  Meleana Bardwell is a 33 y.o. female presenting for 5 day history of persistent right-sided low back pain right ankle pain, right foot pain, right-sided upper back pain.  Symptoms started after she suffered a fall from a faulty step on 05/16/2024.  Patient was seen in a different state and an emergency room.  Had imaging done that was negative.  Was advised to follow-up for physical therapy.  Per patient, her employer is not  going to allow her to return to work without completing physical therapy and documentation stating that she can return to work.  Patient has been using Tylenol  without relief.  PAIN:  Are you having pain? Yes: NPRS scale: 5/10 Pain location: R ankle up leg and into back on the R side Pain description: some numbness and tingling, sometimes it is sharp, comes and goes  Aggravating factors: moving around and chasing the kids, lifting and not using the right mechanics  Relieving factors: the medicine helps but I don't really like  taking it  PRECAUTIONS: None  RED FLAGS: None   WEIGHT BEARING RESTRICTIONS: No  FALLS:  Has patient fallen in last 6 months? Yes. Number of falls 1  LIVING ENVIRONMENT: Lives with: lives with their family Lives in: House/apartment Stairs: Yes: Internal: 20 steps; on right going up  OCCUPATION: [employer]- I stand in one spot for hours   PLOF: Independent  PATIENT GOALS: to get better and move better   NEXT MD VISIT:   OBJECTIVE:  Note: Objective measures were completed at Evaluation unless otherwise noted.  DIAGNOSTIC FINDINGS:  Negative images    COGNITION: Overall cognitive status: Within functional limits for tasks assessed     SENSATION: WFL  MUSCLE LENGTH: Very tight in hamstrings, glutes, piriformis, low back  PALPATION: TTP R glute  LUMBAR ROM:   AROM eval 07/01/24  Flexion 75% with pain WFL  Extension 50% WFL  Right lateral flexion WFL Limited 50%  Left lateral flexion 50% with pain WFL  Right rotation 75% WFL  Left rotation 75%  WFL   (Blank rows = not tested)  LOWER EXTREMITY ROM:   WFL, some pain with ankle PF and leg extension on RLE   LOWER EXTREMITY MMT:    MMT Right eval Left eval  Hip flexion 4-   Hip extension    Hip abduction    Hip adduction    Hip internal rotation    Hip external rotation    Knee flexion 4-   Knee extension 3+ w/pain   Ankle dorsiflexion 4-   Ankle plantarflexion 3+ w/pain    Ankle inversion 4- w/pain   Ankle eversion 3+ w/pain    (Blank rows = not tested)  LUMBAR SPECIAL TESTS:  Straight leg raise test: Positive and FABER test: Positive  FUNCTIONAL TESTS:  5 times sit to stand: 36s  GAIT: Distance walked: in clinic distances Assistive device utilized: None Level of assistance: Complete Independence Comments: antalgic gait, decreased stance time on RLE   TREATMENT DATE:  07/08/24 NuStep 5xSTS Shoulder ext 5# Calf stretch blackTB ext  STS Supine LB and hip stretches  E-stim and moist heat to low back x10 mins   07/01/24 NuStep L5 x4 min STS 2x10 Rows green 2x10 Shoulder Ext green 2x10 Pball roll out lumbar stretch   06/24/24 MHP applied to LB in supine   Bridges 2x5  Feet on pball rotations and knees to chest passive stretches- LTR, HS, ITB, SKTC, DKTC, piriformis, glutes STS 2x10   06/17/24 Laying on MH while passive stretches- LTR, HS, ITB, SKTC, DKTC, piriformis, glutes Feet on pball rotations and knees to chest Bridges x10 STS 2x10 Pball roll out x10   06/09/24- EVAL                                                                                                                                 PATIENT EDUCATION:  Education details: POC, HEP Person educated: Patient Education method:  Explanation and Demonstration Education comprehension: verbalized understanding and returned demonstration  HOME EXERCISE PROGRAM: Access Code: [email or web address] URL: [email or web address] Date: 06/09/2024 Prepared by: Almetta Fam  Exercises - Supine Lower Trunk Rotation  - 1 x daily - 7 x weekly - 2 sets - 10 reps - Supine Bridge  - 1 x daily - 7 x weekly - 2 sets - 10 reps - Supine Single Knee to Chest Stretch  - 1 x daily - 7 x weekly - 2 reps - 15 hold - Supine Figure 4 Piriformis Stretch  - 1 x daily - 7 x weekly - 2 reps - 15 hold - Supine Piriformis Stretch with Foot on Ground  - 1 x daily - 7 x weekly - 2 reps - 15  hold  ASSESSMENT:  CLINICAL IMPRESSION: Patient is a 33 y.o. female who was seen today for physical therapy LBP and R ankle pain. Her pain started from a fall outside in September. She remains very stiff and tight all around with rigid mobility.  She was able to tolerated a mote active session but with increase fatigue and slow movements at times. She has progressed increasing her lumbar ROM. Postural cues needed with rows and extensions.  Patient will benefit from PT to decrease her pain levels and improve her flexibility to be able to return to PLOF and run and play with her kids without difficulty.  OBJECTIVE IMPAIRMENTS: Abnormal gait, decreased activity tolerance, difficulty walking, decreased ROM, decreased strength, impaired flexibility, and pain.   ACTIVITY LIMITATIONS: carrying, lifting, bending, squatting, stairs, and locomotion level  PARTICIPATION LIMITATIONS: cleaning, laundry, community activity, and occupation  PERSONAL FACTORS: Time since onset of injury/illness/exacerbation are also affecting patient's functional outcome.   REHAB POTENTIAL: Good  CLINICAL DECISION MAKING: Stable/uncomplicated  EVALUATION COMPLEXITY: Low   GOALS: Goals reviewed with patient? Yes  SHORT TERM GOALS: Target date: 07/21/24  Patient will be independent with initial HEP.  Baseline:  Goal status: Met 07/01/24  2.  Patient will report centralization of radicular symptoms.  Baseline:  Goal status: ongoing 07/01/24   LONG TERM GOALS: Target date: 09/01/24  Patient will be independent with advanced/ongoing HEP to improve outcomes and carryover.  Baseline:  Goal status: INITIAL  2.  Patient will report 50-75% improvement in low back pain to improve QOL.  Baseline: 8/10 Goal status: INITIAL  3.  Patient will demonstrate full pain free lumbar ROM to perform ADLs.   Baseline:  Goal status: Progressing 07/01/24  4.  Patient will demonstrate improved functional strength as demonstrated  by 5xSTS <20s. Baseline: 36s Goal status: INITIAL  5.  Patient to demonstrate ability to achieve and maintain good spinal alignment/posturing and body mechanics needed for daily activities. Baseline:  Goal status: INITIAL  PLAN:  PT FREQUENCY: 2x/week  PT DURATION: 12 weeks  PLANNED INTERVENTIONS: 97110-Therapeutic exercises, 97530- Therapeutic activity, 97112- Neuromuscular re-education, 97535- Self Care, 02859- Manual therapy, [phone]- Gait training, [phone]- Electrical stimulation (unattended), [phone]- Traction (mechanical), [id number]- Ionotophoresis 4mg /ml Dexamethasone, 20560 (1-2 muscles), 20561 (3+ muscles)- Dry Needling, Patient/Family education, Balance training, Stair training, Taping, Joint mobilization, Spinal mobilization, Cryotherapy, and Moist heat.  PLAN FOR NEXT SESSION: can try heat and e-stim for pain modulation, stretching for LE and low back, traction   Almetta Fam, PT

## 2024-07-10 NOTE — Therapy (Signed)
 OUTPATIENT PHYSICAL THERAPY THORACOLUMBAR TREATMENT   Patient Name: Pallavi Clifton MRN: [id number] DOB:December 08, 1990, 33 y.o., female Today's Date: 07/13/2024  END OF SESSION:  PT End of Session - 07/13/24 0923     Visit Number 5    Date for Recertification  09/01/24    PT Start Time 0925    PT Stop Time 1010    PT Time Calculation (min) 45 min    Activity Tolerance Patient tolerated treatment well    Behavior During Therapy [facility] for tasks assessed/performed            Past Medical History:  Diagnosis Date   Depression    post partum   Eczema    Gestational diabetes    Other allergic rhinitis 07/26/2021   Other atopic dermatitis 07/26/2021   Prediabetes    Seasonal allergies    Past Surgical History:  Procedure Laterality Date   brazillian butt lift  2020   CESAREAN SECTION     DILATION AND CURETTAGE OF UTERUS     X5   LIPOSUCTION     removed from abd and back- infected into hips and butt   WISDOM TOOTH EXTRACTION     Patient Active Problem List   Diagnosis Date Noted   GDM, class A2 09/13/2022   Prior cesarean delivery 08/17/2022   Alpha thalassemia silent carrier 08/05/2022   Carrier of spinal muscular atrophy 08/05/2022   Sickle cell trait 08/05/2022    PCP: No PCP  REFERRING PROVIDER: Delinda Davis  REFERRING DIAG:  S39.012A (ICD-10-CM) - Strain of muscle, fascia and tendon of lower back, initial encounter    Rationale for Evaluation and Treatment: Rehabilitation  THERAPY DIAG:  Muscle weakness (generalized)  Radiculopathy, lumbosacral region  ONSET DATE: 05/16/24  SUBJECTIVE:                                                                                                                                                                                           SUBJECTIVE STATEMENT: My back is feeling good, my leg feels okay too. I have been walking a lot more now. I still feel like I put more pressure on my left leg.   PERTINENT HISTORY:   Cire Brick is a 33 y.o. female presenting for 5 day history of persistent right-sided low back pain right ankle pain, right foot pain, right-sided upper back pain.  Symptoms started after she suffered a fall from a faulty step on 05/16/2024.  Patient was seen in a different state and an emergency room.  Had imaging done that was negative.  Was advised to follow-up for physical therapy.  Per patient, her employer is  not going to allow her to return to work without completing physical therapy and documentation stating that she can return to work.  Patient has been using Tylenol  without relief.  PAIN:  Are you having pain? Yes: NPRS scale: 5/10 Pain location: R ankle up leg and into back on the R side Pain description: some numbness and tingling, sometimes it is sharp, comes and goes  Aggravating factors: moving around and chasing the kids, lifting and not using the right mechanics  Relieving factors: the medicine helps but I don't really like taking it  PRECAUTIONS: None  RED FLAGS: None   WEIGHT BEARING RESTRICTIONS: No  FALLS:  Has patient fallen in last 6 months? Yes. Number of falls 1  LIVING ENVIRONMENT: Lives with: lives with their family Lives in: House/apartment Stairs: Yes: Internal: 20 steps; on right going up  OCCUPATION: [employer]- I stand in one spot for hours   PLOF: Independent  PATIENT GOALS: to get better and move better   NEXT MD VISIT:   OBJECTIVE:  Note: Objective measures were completed at Evaluation unless otherwise noted.  DIAGNOSTIC FINDINGS:  Negative images    COGNITION: Overall cognitive status: Within functional limits for tasks assessed     SENSATION: WFL  MUSCLE LENGTH: Very tight in hamstrings, glutes, piriformis, low back  PALPATION: TTP R glute  LUMBAR ROM:   AROM eval 07/01/24  Flexion 75% with pain WFL  Extension 50% WFL  Right lateral flexion WFL Limited 50%  Left lateral flexion 50% with pain WFL  Right rotation 75% WFL   Left rotation 75%  WFL   (Blank rows = not tested)  LOWER EXTREMITY ROM:   WFL, some pain with ankle PF and leg extension on RLE   LOWER EXTREMITY MMT:    MMT Right eval Left eval  Hip flexion 4-   Hip extension    Hip abduction    Hip adduction    Hip internal rotation    Hip external rotation    Knee flexion 4-   Knee extension 3+ w/pain   Ankle dorsiflexion 4-   Ankle plantarflexion 3+ w/pain   Ankle inversion 4- w/pain   Ankle eversion 3+ w/pain    (Blank rows = not tested)  LUMBAR SPECIAL TESTS:  Straight leg raise test: Positive and FABER test: Positive  FUNCTIONAL TESTS:  5 times sit to stand: 36s  GAIT: Distance walked: in clinic distances Assistive device utilized: None Level of assistance: Complete Independence Comments: antalgic gait, decreased stance time on RLE   TREATMENT DATE:  07/13/24 NuStep L5x38mins  5xSTS- 21s  Shoulder ext 5# 2x10 Calf stretch 20s x2 Seated row 15# 2x10 blackTB ext 2x10 Supine LB and hip stretches- LTR, SKTC, HS   07/01/24 NuStep L5 x4 min STS 2x10 Rows green 2x10 Shoulder Ext green 2x10 Pball roll out lumbar stretch   06/24/24 MHP applied to LB in supine   Bridges 2x5  Feet on pball rotations and knees to chest passive stretches- LTR, HS, ITB, SKTC, DKTC, piriformis, glutes STS 2x10   06/17/24 Laying on MH while passive stretches- LTR, HS, ITB, SKTC, DKTC, piriformis, glutes Feet on pball rotations and knees to chest Bridges x10 STS 2x10 Pball roll out x10   06/09/24- EVAL  PATIENT EDUCATION:  Education details: POC, HEP Person educated: Patient Education method: Medical Illustrator Education comprehension: verbalized understanding and returned demonstration  HOME EXERCISE PROGRAM: Access Code: [email or web address] URL: [email or web address] Date:  06/09/2024 Prepared by: Almetta Fam  Exercises - Supine Lower Trunk Rotation  - 1 x daily - 7 x weekly - 2 sets - 10 reps - Supine Bridge  - 1 x daily - 7 x weekly - 2 sets - 10 reps - Supine Single Knee to Chest Stretch  - 1 x daily - 7 x weekly - 2 reps - 15 hold - Supine Figure 4 Piriformis Stretch  - 1 x daily - 7 x weekly - 2 reps - 15 hold - Supine Piriformis Stretch with Foot on Ground  - 1 x daily - 7 x weekly - 2 reps - 15 hold  ASSESSMENT:  CLINICAL IMPRESSION: Patient is a 33 y.o. female who was seen today for physical therapy LBP and R ankle pain. She reports her pain has gotten better and she has been moving and doing more. We focused more on some exercises today, she is still slow with movements but it starting to tolerate more each session. She remains tight in hips and low back. Postural cues needed exercises for correct form. Patient will benefit from PT to decrease her pain levels and improve her flexibility to be able to return to PLOF and run and play with her kids without difficulty.  OBJECTIVE IMPAIRMENTS: Abnormal gait, decreased activity tolerance, difficulty walking, decreased ROM, decreased strength, impaired flexibility, and pain.   ACTIVITY LIMITATIONS: carrying, lifting, bending, squatting, stairs, and locomotion level  PARTICIPATION LIMITATIONS: cleaning, laundry, community activity, and occupation  PERSONAL FACTORS: Time since onset of injury/illness/exacerbation are also affecting patient's functional outcome.   REHAB POTENTIAL: Good  CLINICAL DECISION MAKING: Stable/uncomplicated  EVALUATION COMPLEXITY: Low   GOALS: Goals reviewed with patient? Yes  SHORT TERM GOALS: Target date: 07/21/24  Patient will be independent with initial HEP.  Baseline:  Goal status: Met 07/01/24  2.  Patient will report centralization of radicular symptoms.  Baseline:  Goal status: ongoing 07/01/24, MET 07/13/24   LONG TERM GOALS: Target date: 09/01/24  Patient  will be independent with advanced/ongoing HEP to improve outcomes and carryover.  Baseline:  Goal status: INITIAL  2.  Patient will report 50-75% improvement in low back pain to improve QOL.  Baseline: 8/10 Goal status: IN PROGRESS 70% 07/13/24  3.  Patient will demonstrate full pain free lumbar ROM to perform ADLs.   Baseline:  Goal status: Progressing 07/01/24  4.  Patient will demonstrate improved functional strength as demonstrated by 5xSTS <20s. Baseline: 36s Goal status: IN PROGRESS 21s 07/13/24  5.  Patient to demonstrate ability to achieve and maintain good spinal alignment/posturing and body mechanics needed for daily activities. Baseline:  Goal status: IN PROGRESS 07/13/24  PLAN:  PT FREQUENCY: 2x/week  PT DURATION: 12 weeks  PLANNED INTERVENTIONS: 97110-Therapeutic exercises, 97530- Therapeutic activity, 97112- Neuromuscular re-education, 97535- Self Care, 02859- Manual therapy, [phone]- Gait training, [phone]- Electrical stimulation (unattended), [phone]- Traction (mechanical), [id number]- Ionotophoresis 4mg /ml Dexamethasone, 79439 (1-2 muscles), 20561 (3+ muscles)- Dry Needling, Patient/Family education, Balance training, Stair training, Taping, Joint mobilization, Spinal mobilization, Cryotherapy, and Moist heat.  PLAN FOR NEXT SESSION: can try heat and e-stim for pain modulation, stretching for LE and low back, traction   Almetta Fam, PT

## 2024-07-13 ENCOUNTER — Ambulatory Visit

## 2024-07-13 DIAGNOSIS — M5417 Radiculopathy, lumbosacral region: Secondary | ICD-10-CM

## 2024-07-13 DIAGNOSIS — M6281 Muscle weakness (generalized): Secondary | ICD-10-CM

## 2024-07-14 ENCOUNTER — Other Ambulatory Visit (HOSPITAL_COMMUNITY)
Admission: RE | Admit: 2024-07-14 | Discharge: 2024-07-14 | Disposition: A | Discharge status: Home / Self Care | Source: Ambulatory Visit | Attending: Obstetrics & Gynecology | Admitting: Obstetrics & Gynecology

## 2024-07-14 ENCOUNTER — Ambulatory Visit

## 2024-07-14 VITALS — BP 118/76 | HR 64 | Wt 200.6 lb

## 2024-07-14 DIAGNOSIS — N760 Acute vaginitis: Secondary | ICD-10-CM

## 2024-07-14 DIAGNOSIS — N898 Other specified noninflammatory disorders of vagina: Secondary | ICD-10-CM | POA: Diagnosis present

## 2024-07-14 DIAGNOSIS — Z3202 Encounter for pregnancy test, result negative: Secondary | ICD-10-CM | POA: Diagnosis not present

## 2024-07-14 DIAGNOSIS — Z3200 Encounter for pregnancy test, result unknown: Secondary | ICD-10-CM

## 2024-07-14 LAB — POCT URINE PREGNANCY: Preg Test, Ur: NEGATIVE

## 2024-07-14 NOTE — Progress Notes (Signed)
..  SUBJECTIVE:  33 y.o. female complains of milky vaginal discharge for 3 day(s). Denies abnormal vaginal bleeding or significant pelvic pain or fever. No UTI symptoms. Denies history of known exposure to STD. Pt reports last cycle only lasted 2-3 days, would like pregnancy test today  LMP 06/30/24  OBJECTIVE:  She appears well, afebrile. Urine dipstick: not done. Negative UPT  ASSESSMENT:  Vaginal Discharge  Vaginal Itching   PLAN:  GC, chlamydia, trichomonas, BVAG, CVAG probe sent to lab. Treatment: To be determined once lab results are received ROV prn if symptoms persist or worsen.

## 2024-07-15 LAB — CERVICOVAGINAL ANCILLARY ONLY
Bacterial Vaginitis (gardnerella): NEGATIVE
Candida Glabrata: NEGATIVE
Candida Vaginitis: POSITIVE — AB
Chlamydia: NEGATIVE
Comment: NEGATIVE
Comment: NEGATIVE
Comment: NEGATIVE
Comment: NEGATIVE
Comment: NEGATIVE
Comment: NORMAL
Neisseria Gonorrhea: NEGATIVE
Trichomonas: NEGATIVE

## 2024-07-21 ENCOUNTER — Ambulatory Visit: Attending: Family Medicine | Admitting: Physical Therapy

## 2024-07-21 DIAGNOSIS — M6281 Muscle weakness (generalized): Secondary | ICD-10-CM | POA: Insufficient documentation

## 2024-07-21 DIAGNOSIS — M5417 Radiculopathy, lumbosacral region: Secondary | ICD-10-CM | POA: Insufficient documentation

## 2024-07-21 DIAGNOSIS — M25571 Pain in right ankle and joints of right foot: Secondary | ICD-10-CM | POA: Insufficient documentation

## 2024-07-28 ENCOUNTER — Ambulatory Visit: Admitting: Physical Therapy

## 2024-07-28 DIAGNOSIS — M6281 Muscle weakness (generalized): Secondary | ICD-10-CM | POA: Diagnosis present

## 2024-07-28 DIAGNOSIS — M5417 Radiculopathy, lumbosacral region: Secondary | ICD-10-CM

## 2024-07-28 DIAGNOSIS — M25571 Pain in right ankle and joints of right foot: Secondary | ICD-10-CM

## 2024-07-28 NOTE — Therapy (Signed)
 OUTPATIENT PHYSICAL THERAPY THORACOLUMBAR TREATMENT   Patient Name: Veronica Bradley MRN: [id number] DOB:23-Aug-1990, 33 y.o., female Today's Date: 07/28/2024  END OF SESSION:  PT End of Session - 07/28/24 1617     Visit Number 6    Date for Recertification  09/01/24    Authorization Type [place] Medicaid    PT Start Time 1617    PT Stop Time 1700    PT Time Calculation (min) 43 min    Activity Tolerance Patient tolerated treatment well    Behavior During Therapy [facility] for tasks assessed/performed             Past Medical History:  Diagnosis Date   Depression    post partum   Eczema    Gestational diabetes    Other allergic rhinitis 07/26/2021   Other atopic dermatitis 07/26/2021   Prediabetes    Seasonal allergies    Past Surgical History:  Procedure Laterality Date   brazillian butt lift  2020   CESAREAN SECTION     DILATION AND CURETTAGE OF UTERUS     X5   LIPOSUCTION     removed from abd and back- infected into hips and butt   WISDOM TOOTH EXTRACTION     Patient Active Problem List   Diagnosis Date Noted   GDM, class A2 09/13/2022   Prior cesarean delivery 08/17/2022   Alpha thalassemia silent carrier 08/05/2022   Carrier of spinal muscular atrophy 08/05/2022   Sickle cell trait 08/05/2022    PCP: No PCP  REFERRING PROVIDER: Delinda Davis  REFERRING DIAG:  S39.012A (ICD-10-CM) - Strain of muscle, fascia and tendon of lower back, initial encounter    Rationale for Evaluation and Treatment: Rehabilitation  THERAPY DIAG:  Muscle weakness (generalized)  Radiculopathy, lumbosacral region  Pain in right ankle and joints of right foot  ONSET DATE: 05/16/24  SUBJECTIVE:                                                                                                                                                                                           SUBJECTIVE STATEMENT:  Pt states that she has been doing more walking lately and has not been able  to complete her exercises as much. Inc pain with rising in the morning.   PERTINENT HISTORY:  Veronica Bradley is a 33 y.o. female presenting for 5 day history of persistent right-sided low back pain right ankle pain, right foot pain, right-sided upper back pain.  Symptoms started after she suffered a fall from a faulty step on 05/16/2024.  Patient was seen in a different state and an emergency room.  Had imaging done  that was negative.  Was advised to follow-up for physical therapy.  Per patient, her employer is not going to allow her to return to work without completing physical therapy and documentation stating that she can return to work.  Patient has been using Tylenol  without relief.  PAIN:  Are you having pain? Yes: NPRS scale: 5/10 R ankle, 6-7/10 back Pain location: R ankle up leg and into back on the R side Pain description: some numbness and tingling, sometimes it is sharp, comes and goes  Aggravating factors: moving around and chasing the kids, lifting and not using the right mechanics  Relieving factors: the medicine helps but I don't really like taking it  PRECAUTIONS: None  RED FLAGS: None   WEIGHT BEARING RESTRICTIONS: No  FALLS:  Has patient fallen in last 6 months? Yes. Number of falls 1  LIVING ENVIRONMENT: Lives with: lives with their family Lives in: House/apartment Stairs: Yes: Internal: 20 steps; on right going up  OCCUPATION: [employer]- I stand in one spot for hours   PLOF: Independent  PATIENT GOALS: to get better and move better   NEXT MD VISIT:   OBJECTIVE:  Note: Objective measures were completed at Evaluation unless otherwise noted.  DIAGNOSTIC FINDINGS:  Negative images    COGNITION: Overall cognitive status: Within functional limits for tasks assessed     SENSATION: WFL  MUSCLE LENGTH: Very tight in hamstrings, glutes, piriformis, low back  PALPATION: TTP R glute  LUMBAR ROM:   AROM eval 07/01/24 07/28/24  Flexion 75% with pain WFL  25% loss, RLE pain inc  Extension 50% WFL 25% loss  Right lateral flexion WFL Limited 50% Nil loss  Left lateral flexion 50% with pain WFL Nil loss  Right rotation 75% WFL   Left rotation 75%  WFL    (Blank rows = not tested)  LOWER EXTREMITY ROM:   WFL, some pain with ankle PF and leg extension on RLE   LOWER EXTREMITY MMT:    MMT Right eval Left eval  Hip flexion 4-   Hip extension    Hip abduction    Hip adduction    Hip internal rotation    Hip external rotation    Knee flexion 4-   Knee extension 3+ w/pain   Ankle dorsiflexion 4-   Ankle plantarflexion 3+ w/pain   Ankle inversion 4- w/pain   Ankle eversion 3+ w/pain    (Blank rows = not tested)  LUMBAR SPECIAL TESTS:  Straight leg raise test: Positive and FABER test: Positive  FUNCTIONAL TESTS:  5 times sit to stand: 36s  GAIT: Distance walked: in clinic distances Assistive device utilized: None Level of assistance: Complete Independence Comments: antalgic gait, decreased stance time on RLE   TREATMENT DATE:  07/28/24 -Repeated lumbar extension in standing x10, dec, better, improved flexion ROM and symptoms - seated physioball press x30 -sciatic nerve glide in sitting 3x10 BLE - seated hamstring isometric 10x10s BLE  -recumbent bike x8 mins -paloff press x30 both directions black TB     07/13/24 NuStep L5x27mins  5xSTS- 21s  Shoulder ext 5# 2x10 Calf stretch 20s x2 Seated row 15# 2x10 blackTB ext 2x10 Supine LB and hip stretches- LTR, SKTC, HS   07/01/24 NuStep L5 x4 min STS 2x10 Rows green 2x10 Shoulder Ext green 2x10 Pball roll out lumbar stretch   06/24/24 MHP applied to LB in supine   Bridges 2x5  Feet on pball rotations and knees to chest passive stretches- LTR, HS, ITB, SKTC, DKTC,  piriformis, glutes STS 2x10   06/17/24 Laying on MH while passive stretches- LTR, HS, ITB, SKTC, DKTC, piriformis, glutes Feet on pball rotations and knees to chest Bridges x10 STS 2x10 Pball roll  out x10   06/09/24- EVAL                                                                                                                                 PATIENT EDUCATION:  Education details: POC, HEP Person educated: Patient Education method: Medical Illustrator Education comprehension: verbalized understanding and returned demonstration  HOME EXERCISE PROGRAM: Access Code: [email or web address] URL: [email or web address] Date: 07/28/2024 Prepared by: Stann Ohara  Exercises - Supine Lower Trunk Rotation  - 1 x daily - 7 x weekly - 2 sets - 10 reps - Supine Bridge  - 1 x daily - 7 x weekly - 2 sets - 10 reps - Supine Single Knee to Chest Stretch  - 1 x daily - 7 x weekly - 2 reps - 15 hold - Supine Figure 4 Piriformis Stretch  - 1 x daily - 7 x weekly - 2 reps - 15 hold - Supine Piriformis Stretch with Foot on Ground  - 1 x daily - 7 x weekly - 2 reps - 15 hold - Standing Back Extension at Wall  - 3 x daily - 7 x weekly - 1 sets - 10 reps - 2 hold  ASSESSMENT:  CLINICAL IMPRESSION: Pt has been repsonding well to    OBJECTIVE IMPAIRMENTS: Abnormal gait, decreased activity tolerance, difficulty walking, decreased ROM, decreased strength, impaired flexibility, and pain.   ACTIVITY LIMITATIONS: carrying, lifting, bending, squatting, stairs, and locomotion level  PARTICIPATION LIMITATIONS: cleaning, laundry, community activity, and occupation  PERSONAL FACTORS: Time since onset of injury/illness/exacerbation are also affecting patient's functional outcome.   REHAB POTENTIAL: Good  CLINICAL DECISION MAKING: Stable/uncomplicated  EVALUATION COMPLEXITY: Low   GOALS: Goals reviewed with patient? Yes  SHORT TERM GOALS: Target date: 07/21/24  Patient will be independent with initial HEP.  Baseline:  Goal status: Met 07/01/24  2.  Patient will report centralization of radicular symptoms.  Baseline:  Goal status: ongoing 07/01/24, MET 07/13/24   LONG TERM  GOALS: Target date: 09/01/24  Patient will be independent with advanced/ongoing HEP to improve outcomes and carryover.  Baseline:  Goal status: INITIAL  2.  Patient will report 50-75% improvement in low back pain to improve QOL.  Baseline: 8/10 Goal status: IN PROGRESS 70% 07/13/24  3.  Patient will demonstrate full pain free lumbar ROM to perform ADLs.   Baseline:  Goal status: Progressing 07/01/24  4.  Patient will demonstrate improved functional strength as demonstrated by 5xSTS <20s. Baseline: 36s Goal status: IN PROGRESS 21s 07/13/24  5.  Patient to demonstrate ability to achieve and maintain good spinal alignment/posturing and body mechanics needed for daily activities. Baseline:  Goal status: IN PROGRESS 07/13/24  PLAN:  PT FREQUENCY: 2x/week  PT DURATION: 12 weeks  PLANNED INTERVENTIONS: 97110-Therapeutic exercises, 97530- Therapeutic activity, [id number]- Neuromuscular re-education, [phone]- Self Care, 02859- Manual therapy, [phone]- Gait training, [phone]- Electrical stimulation (unattended), [phone]- Traction (mechanical), [id number]- Ionotophoresis 4mg /ml Dexamethasone, 20560 (1-2 muscles), 20561 (3+ muscles)- Dry Needling, Patient/Family education, Balance training, Stair training, Taping, Joint mobilization, Spinal mobilization, Cryotherapy, and Moist heat.  PLAN FOR NEXT SESSION: can try heat and e-stim for pain modulation, stretching for LE and low back, traction   Stann DELENA Ohara, PT

## 2024-07-29 ENCOUNTER — Encounter: Payer: Self-pay | Admitting: Physical Therapy

## 2024-07-29 ENCOUNTER — Other Ambulatory Visit: Payer: Self-pay

## 2024-07-29 MED ORDER — FLUCONAZOLE 150 MG PO TABS: 150.0000 mg | ORAL_TABLET | Freq: Once | ORAL | 0 refills | Status: AC

## 2024-08-05 ENCOUNTER — Ambulatory Visit

## 2024-08-07 ENCOUNTER — Ambulatory Visit: Admitting: Physical Therapy

## 2024-08-10 ENCOUNTER — Ambulatory Visit

## 2024-08-10 DIAGNOSIS — M6281 Muscle weakness (generalized): Secondary | ICD-10-CM | POA: Diagnosis not present

## 2024-08-10 DIAGNOSIS — M5417 Radiculopathy, lumbosacral region: Secondary | ICD-10-CM

## 2024-08-10 DIAGNOSIS — M25571 Pain in right ankle and joints of right foot: Secondary | ICD-10-CM

## 2024-08-10 NOTE — Therapy (Signed)
 " OUTPATIENT PHYSICAL THERAPY THORACOLUMBAR TREATMENT   Patient Name: Veronica Bradley MRN: [id number] DOB:07-Nov-1990, 33 y.o., female Today's Date: 08/10/2024  END OF SESSION:  PT End of Session - 08/10/24 1527     Visit Number 7    Date for Recertification  09/01/24    Authorization Type [place] Medicaid    PT Start Time 1452    PT Stop Time 1530    PT Time Calculation (min) 38 min    Activity Tolerance Patient tolerated treatment well    Behavior During Therapy [facility] for tasks assessed/performed          Past Medical History:  Diagnosis Date   Depression    post partum   Eczema    Gestational diabetes    Other allergic rhinitis 07/26/2021   Other atopic dermatitis 07/26/2021   Prediabetes    Seasonal allergies    Past Surgical History:  Procedure Laterality Date   brazillian butt lift  2020   CESAREAN SECTION     DILATION AND CURETTAGE OF UTERUS     X5   LIPOSUCTION     removed from abd and back- infected into hips and butt   WISDOM TOOTH EXTRACTION     Patient Active Problem List   Diagnosis Date Noted   GDM, class A2 09/13/2022   Prior cesarean delivery 08/17/2022   Alpha thalassemia silent carrier 08/05/2022   Carrier of spinal muscular atrophy 08/05/2022   Sickle cell trait 08/05/2022    PCP: No PCP  REFERRING PROVIDER: Delinda Bradley  REFERRING DIAG:  S39.012A (ICD-10-CM) - Strain of muscle, fascia and tendon of lower back, initial encounter    Rationale for Evaluation and Treatment: Rehabilitation  THERAPY DIAG:  Muscle weakness (generalized)  Radiculopathy, lumbosacral region  Pain in right ankle and joints of right foot  ONSET DATE: 05/16/24  SUBJECTIVE:                                                                                                                                                                                           SUBJECTIVE STATEMENT:  Pt reported she experiences most of her back stiffness in the morning when she  first wakes up. Pt reported some low back stiffness upon arrival of PT today.   PERTINENT HISTORY:  Veronica Bradley is a 33 y.o. female presenting for 5 day history of persistent right-sided low back pain right ankle pain, right foot pain, right-sided upper back pain.  Symptoms started after she suffered a fall from a faulty step on 05/16/2024.  Patient was seen in a different state and an emergency room.  Had imaging done that was  negative.  Was advised to follow-up for physical therapy.  Per patient, her employer is not going to allow her to return to work without completing physical therapy and documentation stating that she can return to work.  Patient has been using Tylenol  without relief.  PAIN:  Are you having pain? Yes: NPRS scale: 5/10 R ankle, 6-7/10 back Pain location: R ankle up leg and into back on the R side Pain description: some numbness and tingling, sometimes it is sharp, comes and goes  Aggravating factors: moving around and chasing the kids, lifting and not using the right mechanics  Relieving factors: the medicine helps but I don't really like taking it  PRECAUTIONS: None  RED FLAGS: None   WEIGHT BEARING RESTRICTIONS: No  FALLS:  Has patient fallen in last 6 months? Yes. Number of falls 1  LIVING ENVIRONMENT: Lives with: lives with their family Lives in: House/apartment Stairs: Yes: Internal: 20 steps; on right going up  OCCUPATION: [employer]- I stand in one spot for hours   PLOF: Independent  PATIENT GOALS: to get better and move better   NEXT MD VISIT:   OBJECTIVE:  Note: Objective measures were completed at Evaluation unless otherwise noted.  DIAGNOSTIC FINDINGS:  Negative images    COGNITION: Overall cognitive status: Within functional limits for tasks assessed     SENSATION: WFL  MUSCLE LENGTH: Very tight in hamstrings, glutes, piriformis, low back  PALPATION: TTP R glute  LUMBAR ROM:   AROM eval 07/01/24 07/28/24  Flexion 75% with pain  WFL 25% loss, RLE pain inc  Extension 50% WFL 25% loss  Right lateral flexion WFL Limited 50% Nil loss  Left lateral flexion 50% with pain WFL Nil loss  Right rotation 75% WFL   Left rotation 75%  WFL    (Blank rows = not tested)  LOWER EXTREMITY ROM:   WFL, some pain with ankle PF and leg extension on RLE   LOWER EXTREMITY MMT:    MMT Right eval Left eval  Hip flexion 4-   Hip extension    Hip abduction    Hip adduction    Hip internal rotation    Hip external rotation    Knee flexion 4-   Knee extension 3+ w/pain   Ankle dorsiflexion 4-   Ankle plantarflexion 3+ w/pain   Ankle inversion 4- w/pain   Ankle eversion 3+ w/pain    (Blank rows = not tested)  LUMBAR SPECIAL TESTS:  Straight leg raise test: Positive and FABER test: Positive  FUNCTIONAL TESTS:  5 times sit to stand: 36s  GAIT: Distance walked: in clinic distances Assistive device utilized: None Level of assistance: Complete Independence Comments: antalgic gait, decreased stance time on RLE   TREATMENT DATE:  08/10/24 SB Rollouts  Reassessment of STS goal and pain free ROM goal  Nustep L5 x 5 min Step Ups 6 2x10 Pallof Press 15# 2x10 Resisted lateral steps 20#  07/28/24 -Repeated lumbar extension in standing x10, dec, better, improved flexion ROM and symptoms - seated physioball press x30 -sciatic nerve glide in sitting 3x10 BLE - seated hamstring isometric 10x10s BLE  -paloff press x30 both directions black TB -Trialed lumbar extension in standing vs lying x10, pt prefers standing  07/13/24 NuStep L5x41mins  5xSTS- 21s  Shoulder ext 5# 2x10 Calf stretch 20s x2 Seated row 15# 2x10 blackTB ext 2x10 Supine LB and hip stretches- LTR, SKTC, HS   07/01/24 NuStep L5 x4 min STS 2x10 Rows green 2x10 Shoulder Ext green 2x10  Pball roll out lumbar stretch   06/24/24 MHP applied to LB in supine   Bridges 2x5  Feet on pball rotations and knees to chest passive stretches- LTR, HS, ITB, SKTC,  DKTC, piriformis, glutes STS 2x10   06/17/24 Laying on MH while passive stretches- LTR, HS, ITB, SKTC, DKTC, piriformis, glutes Feet on pball rotations and knees to chest Bridges x10 STS 2x10 Pball roll out x10   06/09/24- EVAL                                                                                                                                 PATIENT EDUCATION:  Education details: POC, HEP Person educated: Patient Education method: Medical Illustrator Education comprehension: verbalized understanding and returned demonstration  HOME EXERCISE PROGRAM: Access Code: [email or web address] URL: [email or web address] Date: 07/28/2024 Prepared by: Stann Ohara  Exercises - Supine Lower Trunk Rotation  - 1 x daily - 7 x weekly - 2 sets - 10 reps - Supine Bridge  - 1 x daily - 7 x weekly - 2 sets - 10 reps - Supine Single Knee to Chest Stretch  - 1 x daily - 7 x weekly - 2 reps - 15 hold - Supine Figure 4 Piriformis Stretch  - 1 x daily - 7 x weekly - 2 reps - 15 hold - Supine Piriformis Stretch with Foot on Ground  - 1 x daily - 7 x weekly - 2 reps - 15 hold - Standing Back Extension at Wall  - 3 x daily - 7 x weekly - 1 sets - 10 reps - 2 hold  ASSESSMENT:  CLINICAL IMPRESSION: Pt has been repsonding well to her current regimen and was able to progress strength with ability to progress resistance training today with added weight. Pt will benefit from continued standing activity in order to build endurance and upright tolerance. Pt only required SBA for dynamic standing activities today.   OBJECTIVE IMPAIRMENTS: Abnormal gait, decreased activity tolerance, difficulty walking, decreased ROM, decreased strength, impaired flexibility, and pain.   ACTIVITY LIMITATIONS: carrying, lifting, bending, squatting, stairs, and locomotion level  PARTICIPATION LIMITATIONS: cleaning, laundry, community activity, and occupation  PERSONAL FACTORS: Time since onset of  injury/illness/exacerbation are also affecting patient's functional outcome.   REHAB POTENTIAL: Good  CLINICAL DECISION MAKING: Stable/uncomplicated  EVALUATION COMPLEXITY: Low   GOALS: Goals reviewed with patient? Yes  SHORT TERM GOALS: Target date: 07/21/24  Patient will be independent with initial HEP.  Baseline:  Goal status: Met 07/01/24  2.  Patient will report centralization of radicular symptoms.  Baseline:  Goal status: ongoing 07/01/24, MET 07/13/24   LONG TERM GOALS: Target date: 09/21/24  Patient will be independent with advanced/ongoing HEP to improve outcomes and carryover.  Baseline:  Goal status: MET 08/10/24  2.  Patient will report 50-75% improvement in low back pain to improve QOL.  Baseline: 8/10 Goal status: IN PROGRESS 70% 07/13/24; 5/10 08/10/24  3.  Patient  will demonstrate full pain free lumbar ROM to perform ADLs.   Baseline:  Goal status: Progressing 07/01/24; Pain with flexion and lateral flexion 08/10/24  4.  Patient will demonstrate improved functional strength as demonstrated by 5xSTS <20s. Baseline: 36s Goal status: IN PROGRESS 20.19s 08/10/24  5.  Patient to demonstrate ability to achieve and maintain good spinal alignment/posturing and body mechanics needed for daily activities. Baseline:  Goal status: IN PROGRESS 08/10/24  PLAN:  PT FREQUENCY: 2x/week  PT DURATION: 12 weeks  PLANNED INTERVENTIONS: 97110-Therapeutic exercises, 97530- Therapeutic activity, 97112- Neuromuscular re-education, 97535- Self Care, 02859- Manual therapy, [phone]- Gait training, [phone]- Electrical stimulation (unattended), [phone]- Traction (mechanical), [id number]- Ionotophoresis 4mg /ml Dexamethasone, 79439 (1-2 muscles), 20561 (3+ muscles)- Dry Needling, Patient/Family education, Balance training, Stair training, Taping, Joint mobilization, Spinal mobilization, Cryotherapy, and Moist heat.  PLAN FOR NEXT SESSION: can try heat and e-stim for pain modulation,  stretching for LE and low back, traction   Thersia Alder, Student-PT 08/10/24  "

## 2024-08-25 ENCOUNTER — Ambulatory Visit

## 2024-09-01 ENCOUNTER — Ambulatory Visit

## 2024-09-08 ENCOUNTER — Ambulatory Visit

## 2024-09-15 ENCOUNTER — Ambulatory Visit

## 2024-09-24 ENCOUNTER — Ambulatory Visit: Payer: Self-pay | Admitting: Allergy & Immunology
